# Patient Record
Sex: Female | Born: 1960 | Race: Black or African American | Hispanic: No | Marital: Single | Smoking: Never smoker
Health system: Southern US, Community
[De-identification: ages and names within clinical notes are randomized; demographics above are authoritative.]

## PROBLEM LIST (undated history)

## (undated) DIAGNOSIS — N959 Unspecified menopausal and perimenopausal disorder: Secondary | ICD-10-CM

## (undated) DIAGNOSIS — Q859 Phakomatosis, unspecified: Secondary | ICD-10-CM

## (undated) DIAGNOSIS — G4700 Insomnia, unspecified: Secondary | ICD-10-CM

## (undated) DIAGNOSIS — R739 Hyperglycemia, unspecified: Secondary | ICD-10-CM

## (undated) DIAGNOSIS — E7800 Pure hypercholesterolemia, unspecified: Secondary | ICD-10-CM

## (undated) DIAGNOSIS — Z803 Family history of malignant neoplasm of breast: Secondary | ICD-10-CM

## (undated) DIAGNOSIS — I10 Essential (primary) hypertension: Secondary | ICD-10-CM

## (undated) DIAGNOSIS — K219 Gastro-esophageal reflux disease without esophagitis: Secondary | ICD-10-CM

## (undated) DIAGNOSIS — E119 Type 2 diabetes mellitus without complications: Secondary | ICD-10-CM

## (undated) DIAGNOSIS — H8110 Benign paroxysmal vertigo, unspecified ear: Secondary | ICD-10-CM

## (undated) DIAGNOSIS — Z800 Family history of malignant neoplasm of digestive organs: Secondary | ICD-10-CM

## (undated) DIAGNOSIS — Z8041 Family history of malignant neoplasm of ovary: Secondary | ICD-10-CM

## (undated) DIAGNOSIS — M1990 Unspecified osteoarthritis, unspecified site: Secondary | ICD-10-CM

## (undated) DIAGNOSIS — R748 Abnormal levels of other serum enzymes: Secondary | ICD-10-CM

## (undated) HISTORY — PX: LAPAROSCOPIC CHOLECYSTECTOMY: SUR755

## (undated) HISTORY — DX: Family history of malignant neoplasm of digestive organs: Z80.0

## (undated) HISTORY — DX: Essential (primary) hypertension: I10

## (undated) HISTORY — DX: Gastro-esophageal reflux disease without esophagitis: K21.9

## (undated) HISTORY — DX: Benign paroxysmal vertigo, unspecified ear: H81.10

## (undated) HISTORY — DX: Unspecified menopausal and perimenopausal disorder: N95.9

## (undated) HISTORY — DX: Phakomatosis, unspecified: Q85.9

## (undated) HISTORY — DX: Pure hypercholesterolemia, unspecified: E78.00

## (undated) HISTORY — DX: Abnormal levels of other serum enzymes: R74.8

## (undated) HISTORY — PX: ABDOMINAL HYSTERECTOMY: SHX81

## (undated) HISTORY — DX: Type 2 diabetes mellitus without complications: E11.9

## (undated) HISTORY — DX: Family history of malignant neoplasm of breast: Z80.3

## (undated) HISTORY — PX: TOTAL ABDOMINAL HYSTERECTOMY W/ BILATERAL SALPINGOOPHORECTOMY: SHX83

## (undated) HISTORY — DX: Hyperglycemia, unspecified: R73.9

## (undated) HISTORY — DX: Family history of malignant neoplasm of ovary: Z80.41

## (undated) HISTORY — DX: Insomnia, unspecified: G47.00

---

## 2001-09-29 ENCOUNTER — Other Ambulatory Visit: Admission: RE | Admit: 2001-09-29 | Discharge: 2001-09-29 | Payer: Self-pay | Admitting: Family Medicine

## 2004-10-03 ENCOUNTER — Ambulatory Visit: Payer: Self-pay | Admitting: Family Medicine

## 2005-11-27 ENCOUNTER — Ambulatory Visit: Payer: Self-pay | Admitting: Family Medicine

## 2006-12-01 ENCOUNTER — Ambulatory Visit: Payer: Self-pay | Admitting: Family Medicine

## 2007-01-05 DIAGNOSIS — E7800 Pure hypercholesterolemia, unspecified: Secondary | ICD-10-CM | POA: Insufficient documentation

## 2007-05-07 ENCOUNTER — Ambulatory Visit: Payer: Self-pay | Admitting: Unknown Physician Specialty

## 2007-09-16 DIAGNOSIS — Z800 Family history of malignant neoplasm of digestive organs: Secondary | ICD-10-CM | POA: Insufficient documentation

## 2007-10-28 ENCOUNTER — Ambulatory Visit: Payer: Self-pay | Admitting: Gastroenterology

## 2008-02-25 ENCOUNTER — Ambulatory Visit: Payer: Self-pay

## 2008-03-17 DIAGNOSIS — G4700 Insomnia, unspecified: Secondary | ICD-10-CM | POA: Insufficient documentation

## 2009-03-07 ENCOUNTER — Ambulatory Visit: Payer: Self-pay

## 2010-03-15 ENCOUNTER — Ambulatory Visit: Payer: Self-pay

## 2011-05-07 ENCOUNTER — Ambulatory Visit: Payer: Self-pay

## 2011-05-16 ENCOUNTER — Ambulatory Visit: Payer: Self-pay | Admitting: General Practice

## 2011-05-18 ENCOUNTER — Ambulatory Visit: Payer: Self-pay | Admitting: General Practice

## 2011-06-17 ENCOUNTER — Ambulatory Visit: Payer: Self-pay | Admitting: General Practice

## 2012-05-13 ENCOUNTER — Ambulatory Visit: Payer: Self-pay | Admitting: Family Medicine

## 2013-01-11 ENCOUNTER — Ambulatory Visit: Payer: Self-pay | Admitting: Gastroenterology

## 2013-07-28 ENCOUNTER — Ambulatory Visit: Payer: Self-pay | Admitting: Family Medicine

## 2013-08-27 ENCOUNTER — Ambulatory Visit: Payer: Self-pay | Admitting: Specialist

## 2013-09-22 ENCOUNTER — Encounter: Payer: Self-pay | Admitting: Specialist

## 2013-10-17 ENCOUNTER — Encounter: Payer: Self-pay | Admitting: Specialist

## 2014-01-19 LAB — HM PAP SMEAR: HM PAP: NEGATIVE

## 2014-01-28 ENCOUNTER — Ambulatory Visit: Payer: Self-pay | Admitting: Cardiovascular Disease

## 2014-01-31 ENCOUNTER — Ambulatory Visit (INDEPENDENT_AMBULATORY_CARE_PROVIDER_SITE_OTHER): Payer: 59 | Admitting: Cardiovascular Disease

## 2014-01-31 ENCOUNTER — Encounter (INDEPENDENT_AMBULATORY_CARE_PROVIDER_SITE_OTHER): Payer: Self-pay

## 2014-01-31 ENCOUNTER — Encounter: Payer: Self-pay | Admitting: Cardiovascular Disease

## 2014-01-31 VITALS — BP 120/90 | HR 67 | Ht 72.0 in | Wt 235.0 lb

## 2014-01-31 DIAGNOSIS — M25519 Pain in unspecified shoulder: Secondary | ICD-10-CM | POA: Insufficient documentation

## 2014-01-31 DIAGNOSIS — E785 Hyperlipidemia, unspecified: Secondary | ICD-10-CM

## 2014-01-31 DIAGNOSIS — K219 Gastro-esophageal reflux disease without esophagitis: Secondary | ICD-10-CM

## 2014-01-31 DIAGNOSIS — R9431 Abnormal electrocardiogram [ECG] [EKG]: Secondary | ICD-10-CM

## 2014-01-31 DIAGNOSIS — R7303 Prediabetes: Secondary | ICD-10-CM | POA: Insufficient documentation

## 2014-01-31 DIAGNOSIS — R079 Chest pain, unspecified: Secondary | ICD-10-CM

## 2014-01-31 DIAGNOSIS — Z8249 Family history of ischemic heart disease and other diseases of the circulatory system: Secondary | ICD-10-CM

## 2014-01-31 DIAGNOSIS — R7309 Other abnormal glucose: Secondary | ICD-10-CM

## 2014-01-31 MED ORDER — PANTOPRAZOLE SODIUM 40 MG PO TBEC: 40.0000 mg | DELAYED_RELEASE_TABLET | Freq: Two times a day (BID) | ORAL | Status: DC

## 2014-01-31 NOTE — Assessment & Plan Note (Signed)
Complemented her on her recent weight loss. Encouraged her to continue walking

## 2014-01-31 NOTE — Assessment & Plan Note (Signed)
Nonspecific EKG changes, no significant difference from 2010. As she feels well with no symptoms concerning for angina, no further testing at this time. We did spend time talking about various anginal presentations and she will watch her symptoms closely with exertion.

## 2014-01-31 NOTE — Assessment & Plan Note (Signed)
She does report mildly elevated CK. This could be from muscle cramping associated with her simvastatin. She does have shoulder cramping at times. We did discuss possibly changing to an alternate statin. One option would be to try low-dose Crestor 5 mg daily. We have samples and coupons if she would like to try this.

## 2014-01-31 NOTE — Assessment & Plan Note (Addendum)
Her biggest complaint is GERD. She sleeping on 6 pillows. We have suggested she use last pillows, put a block under the head of her bed. We have sent in a prescription for Protonix 40 mg twice a day as she has not tried this PPI. She reports AcipHex is not working for her. Suggested she supplement with Pepcid or Zantac. If symptoms persist, she may benefit from an EGD

## 2014-01-31 NOTE — Patient Instructions (Signed)
You are doing well. Minimal EKG changes, very nonspecific Try protonix one pill twice a day for GERD  If shoulder cramping gets worse, call the office We could switch the simvastatin to alternate statin  Use less pillows, prop the feet of the bed  Please call us if you have new issues that need to be addressed before your next appt.

## 2014-01-31 NOTE — Assessment & Plan Note (Signed)
Symptoms seem musculoskeletal. Possibly exacerbated by her statin. Symptoms get worse, could consider changing her statin

## 2014-01-31 NOTE — Progress Notes (Signed)
Patient ID: Peter Garter, female    DOB: 1960-12-05, 53 y.o.   MRN: [id number]  HPI Comments: Ms Richeson is a very pleasant 53 year old woman with history of hyperlipidemia, borderline diabetes, who works at [employer] who presents by referral from Dolores Frame for abnormal EKG.  She reports that she is very active, does lots of walking on a regular basis, particularly in the weather. She does have occasional shoulder pain, sometimes on one side, sometimes on the other that she describes as a cramping. Sometimes has this shoulder pain at rest as well such as when she wakes up. She's been told that her CK is elevated and is being closely monitored.  Reports having a hemoglobin A1c the high 5, low 6 range She has recently lost significant weight through dietary changes. Overall she feels that she is doing well Her biggest complaint is her GERD . She has frequent belching, burning. She has tried omeprazole, Nexium, dexilant (allergy) She's not tried Protonix. She is currently on AcipHex and feels that it is not working. She is sleeping on 6 pillows an effort to help her symptoms.  EKG shows normal sinus rhythm with no significant ST changes. Nonspecific T wave abnormality in the inferior leads. No significant change from prior EKG in 2000 and. Similar to EKG at outside office 01/19/2014  Reports total cholesterol less than 200, possibly 180   Outpatient Encounter Prescriptions as of 01/31/2014  Medication Sig  . hydrochlorothiazide (MICROZIDE) 12.5 MG capsule Take 12.5 mg by mouth daily as needed.   . meloxicam (MOBIC) 15 MG tablet Take 15 mg by mouth daily.  . RABEprazole (ACIPHEX) 20 MG tablet Take 20 mg by mouth daily.  . simvastatin (ZOCOR) 10 MG tablet Take 10 mg by mouth daily.  . [DISCONTINUED] ALPRAZolam (XANAX) 0.5 MG tablet Take 0.5 mg by mouth at bedtime as needed for anxiety.     Review of Systems  Constitutional: Negative.   HENT: Negative.   Eyes: Negative.   Respiratory: Negative.    Cardiovascular: Negative.   Gastrointestinal: Negative.   Endocrine: Negative.   Musculoskeletal: Negative.   Skin: Negative.   Allergic/Immunologic: Negative.   Neurological: Negative.   Hematological: Negative.   Psychiatric/Behavioral: Negative.   All other systems reviewed and are negative.   BP 120/90  Pulse 67  Ht 6' (1.829 m)  Wt 235 lb (106.595 kg)  BMI 31.86 kg/m2  Physical Exam  Nursing note and vitals reviewed. Constitutional: She is oriented to person, place, and time. She appears well-developed and well-nourished.  HENT:  Head: Normocephalic.  Nose: Nose normal.  Mouth/Throat: Oropharynx is clear and moist.  Eyes: Conjunctivae are normal. Pupils are equal, round, and reactive to light.  Neck: Normal range of motion. Neck supple. No JVD present.  Cardiovascular: Normal rate, regular rhythm, S1 normal, S2 normal, normal heart sounds and intact distal pulses.  Exam reveals no gallop and no friction rub.   No murmur heard. Pulmonary/Chest: Effort normal and breath sounds normal. No respiratory distress. She has no wheezes. She has no rales. She exhibits no tenderness.  Abdominal: Soft. Bowel sounds are normal. She exhibits no distension. There is no tenderness.  Musculoskeletal: Normal range of motion. She exhibits no edema and no tenderness.  Lymphadenopathy:    She has no cervical adenopathy.  Neurological: She is alert and oriented to person, place, and time. Coordination normal.  Skin: Skin is warm and dry. No rash noted. No erythema.  Psychiatric: She has a normal mood and affect.  Her behavior is normal. Judgment and thought content normal.    Assessment and Plan

## 2014-02-14 ENCOUNTER — Ambulatory Visit: Payer: Self-pay | Admitting: Gastroenterology

## 2014-02-14 LAB — HM COLONOSCOPY: HM Colonoscopy: 1

## 2014-02-23 ENCOUNTER — Ambulatory Visit: Payer: Self-pay | Admitting: Family Medicine

## 2014-05-25 LAB — HEMOGLOBIN A1C: Hgb A1c MFr Bld: 6.1 % — AB (ref 4.0–6.0)

## 2014-07-03 ENCOUNTER — Emergency Department: Payer: Self-pay | Admitting: Emergency Medicine

## 2014-07-03 LAB — COMPREHENSIVE METABOLIC PANEL
ALK PHOS: 53 U/L
Albumin: 4.5 g/dL (ref 3.4–5.0)
Anion Gap: 8 (ref 7–16)
BUN: 17 mg/dL (ref 7–18)
Bilirubin,Total: 0.4 mg/dL (ref 0.2–1.0)
CALCIUM: 9.7 mg/dL (ref 8.5–10.1)
CO2: 30 mmol/L (ref 21–32)
Chloride: 103 mmol/L (ref 98–107)
Creatinine: 1.07 mg/dL (ref 0.60–1.30)
EGFR (African American): >60
EGFR (Non-African Amer.): 57 — ABNORMAL LOW
Glucose: 102 mg/dL — ABNORMAL HIGH (ref 65–99)
Osmolality: 283 (ref 275–301)
Potassium: 4.1 mmol/L (ref 3.5–5.1)
SGOT(AST): 23 U/L (ref 15–37)
SGPT (ALT): 43 U/L
Sodium: 141 mmol/L (ref 136–145)
TOTAL PROTEIN: 8.3 g/dL — AB (ref 6.4–8.2)

## 2014-07-03 LAB — CBC WITH DIFFERENTIAL/PLATELET
Basophil #: 0 10*3/uL (ref 0.0–0.1)
Basophil %: 0.9 %
EOS ABS: 0.1 10*3/uL (ref 0.0–0.7)
Eosinophil %: 1.2 %
HCT: 41.3 % (ref 35.0–47.0)
HGB: 13 g/dL (ref 12.0–16.0)
Lymphocyte #: 3.1 10*3/uL (ref 1.0–3.6)
Lymphocyte %: 55.6 %
MCH: 24.6 pg — AB (ref 26.0–34.0)
MCHC: 31.6 g/dL — ABNORMAL LOW (ref 32.0–36.0)
MCV: 78 fL — AB (ref 80–100)
MONO ABS: 0.3 x10 3/mm (ref 0.2–0.9)
MONOS PCT: 5.4 %
NEUTROS ABS: 2 10*3/uL (ref 1.4–6.5)
NEUTROS PCT: 36.9 %
PLATELETS: 264 10*3/uL (ref 150–440)
RBC: 5.3 10*6/uL — AB (ref 3.80–5.20)
RDW: 14.5 % (ref 11.5–14.5)
WBC: 5.5 10*3/uL (ref 3.6–11.0)

## 2014-07-03 LAB — TSH: Thyroid Stimulating Horm: 3.99 u[IU]/mL

## 2014-07-03 LAB — CK: CK, Total: 446 U/L — ABNORMAL HIGH

## 2014-07-13 LAB — HEPATIC FUNCTION PANEL
ALT: 42 U/L — AB (ref 7–35)
AST: 27 U/L (ref 13–35)

## 2014-07-13 LAB — CBC AND DIFFERENTIAL
HEMATOCRIT: 38 % (ref 36–46)
Hemoglobin: 13.1 g/dL (ref 12.0–16.0)
Platelets: 303 10*3/uL (ref 150–399)
WBC: 5.6 10^3/mL

## 2014-07-13 LAB — BASIC METABOLIC PANEL
BUN: 14 mg/dL (ref 4–21)
Creatinine: 1.1 mg/dL (ref 0.5–1.1)
GLUCOSE: 88 mg/dL
POTASSIUM: 4.4 mmol/L (ref 3.4–5.3)
SODIUM: 141 mmol/L (ref 137–147)

## 2014-08-01 LAB — LIPID PANEL
CHOLESTEROL: 250 mg/dL — AB (ref 0–200)
HDL: 49 mg/dL (ref 35–70)
LDL CALC: 163 mg/dL
Triglycerides: 192 mg/dL — AB (ref 40–160)

## 2014-08-10 ENCOUNTER — Ambulatory Visit: Payer: Self-pay | Admitting: Family Medicine

## 2014-08-10 LAB — HM MAMMOGRAPHY

## 2014-12-19 ENCOUNTER — Encounter: Payer: Self-pay | Admitting: *Deleted

## 2015-01-25 DIAGNOSIS — R748 Abnormal levels of other serum enzymes: Secondary | ICD-10-CM | POA: Insufficient documentation

## 2015-01-25 DIAGNOSIS — E010 Iodine-deficiency related diffuse (endemic) goiter: Secondary | ICD-10-CM | POA: Insufficient documentation

## 2015-01-25 DIAGNOSIS — I10 Essential (primary) hypertension: Secondary | ICD-10-CM | POA: Insufficient documentation

## 2015-01-25 DIAGNOSIS — F430 Acute stress reaction: Secondary | ICD-10-CM | POA: Insufficient documentation

## 2015-01-25 DIAGNOSIS — E538 Deficiency of other specified B group vitamins: Secondary | ICD-10-CM | POA: Insufficient documentation

## 2015-01-25 DIAGNOSIS — H8110 Benign paroxysmal vertigo, unspecified ear: Secondary | ICD-10-CM | POA: Insufficient documentation

## 2015-01-25 DIAGNOSIS — R9431 Abnormal electrocardiogram [ECG] [EKG]: Secondary | ICD-10-CM | POA: Insufficient documentation

## 2015-01-25 DIAGNOSIS — E559 Vitamin D deficiency, unspecified: Secondary | ICD-10-CM | POA: Insufficient documentation

## 2015-03-29 ENCOUNTER — Ambulatory Visit (INDEPENDENT_AMBULATORY_CARE_PROVIDER_SITE_OTHER): Payer: 59 | Admitting: Family Medicine

## 2015-03-29 ENCOUNTER — Other Ambulatory Visit: Payer: Self-pay | Admitting: Family Medicine

## 2015-03-29 ENCOUNTER — Encounter: Payer: Self-pay | Admitting: Family Medicine

## 2015-03-29 VITALS — BP 124/80 | HR 84 | Temp 98.1°F | Resp 16 | Ht 72.0 in | Wt 251.0 lb

## 2015-03-29 DIAGNOSIS — R7309 Other abnormal glucose: Secondary | ICD-10-CM

## 2015-03-29 DIAGNOSIS — R7303 Prediabetes: Secondary | ICD-10-CM

## 2015-03-29 DIAGNOSIS — Z1239 Encounter for other screening for malignant neoplasm of breast: Secondary | ICD-10-CM | POA: Diagnosis not present

## 2015-03-29 DIAGNOSIS — Z0000 Encounter for general adult medical examination without abnormal findings: Secondary | ICD-10-CM | POA: Diagnosis not present

## 2015-03-29 DIAGNOSIS — E7800 Pure hypercholesterolemia, unspecified: Secondary | ICD-10-CM

## 2015-03-29 DIAGNOSIS — R319 Hematuria, unspecified: Secondary | ICD-10-CM

## 2015-03-29 DIAGNOSIS — E559 Vitamin D deficiency, unspecified: Secondary | ICD-10-CM | POA: Diagnosis not present

## 2015-03-29 DIAGNOSIS — I10 Essential (primary) hypertension: Secondary | ICD-10-CM

## 2015-03-29 LAB — POCT URINALYSIS DIPSTICK
BILIRUBIN UA: NEGATIVE
GLUCOSE UA: NEGATIVE
Ketones, UA: NEGATIVE
Leukocytes, UA: NEGATIVE
Nitrite, UA: NEGATIVE
PROTEIN UA: NEGATIVE
Spec Grav, UA: 1.025
Urobilinogen, UA: 0.2
pH, UA: 5

## 2015-03-29 NOTE — Progress Notes (Signed)
Patient ID: Alexandra Mora, female   DOB: 03/08/1961, 54 y.o.   MRN: [id number]       Patient: Alexandra Mora, Female    DOB: 1961/07/27, 54 y.o.   MRN: [id number] Visit Date: 03/29/2015  Today's Provider: Margarita Rana, MD   Chief Complaint  Patient presents with  . Annual Exam   Subjective:    Annual physical exam Alexandra Mora is a 54 y.o. female who presents today for health maintenance and complete physical. She feels fairly well. She reports not exercising due to back and hip pain. She reports she is sleeping fairly well. Does think she is having trouble with menopause.   Still having hot flashes. Does not want take hormones. Having a lot of joint issues.   Does not want to take Mobic.  01/19/14 CPE 01/19/14 Pap-neg 08/10/14 Mammo-BI-RADS 1 02/14/14 Colonoscopy-polyp,recheck in 3 yrs 01/19/14 EKG    Results for orders placed or performed in visit on 03/29/15  POCT urinalysis dipstick  Result Value Ref Range   Color, UA straw    Clarity, UA clear    Glucose, UA neg    Bilirubin, UA neg    Ketones, UA neg    Spec Grav, UA 1.025    Blood, UA small    pH, UA 5.0    Protein, UA neg    Urobilinogen, UA 0.2    Nitrite, UA neg    Leukocytes, UA Negative Negative     Lab Results  Component Value Date   WBC 5.6 07/13/2014   HGB 13.1 07/13/2014   HCT 38 07/13/2014   PLT 303 07/13/2014   GLUCOSE 102* 07/03/2014   CHOL 250* 08/01/2014   TRIG 192* 08/01/2014   HDL 49 08/01/2014   LDLCALC 163 08/01/2014   ALT 42* 07/13/2014   AST 27 07/13/2014   NA 141 07/13/2014   K 4.4 07/13/2014   CL 103 07/03/2014   CREATININE 1.1 07/13/2014   BUN 14 07/13/2014   CO2 30 07/03/2014   HGBA1C 6.1* 05/25/2014    -----------------------------------------------------------------   Review of Systems  Constitutional: Negative.   HENT: Negative.   Eyes: Negative.   Respiratory: Negative.   Cardiovascular: Negative.   Gastrointestinal: Negative.   Endocrine: Negative.   Genitourinary:  Negative.   Musculoskeletal: Positive for back pain and arthralgias.  Skin: Negative.   Allergic/Immunologic: Negative.   Neurological: Negative.   Hematological: Negative.   Psychiatric/Behavioral: Negative.     Social History She  reports that she has never smoked. She has never used smokeless tobacco. She reports that she does not drink alcohol or use illicit drugs.  Patient Active Problem List   Diagnosis Date Noted  . Acute stress disorder 01/25/2015  . Benign paroxysmal positional nystagmus 01/25/2015  . Abnormal ECG 01/25/2015  . Elevated CK 01/25/2015  . Big thyroid 01/25/2015  . BP (high blood pressure) 01/25/2015  . Low serum cobalamin 01/25/2015  . Avitaminosis D 01/25/2015  . GERD (gastroesophageal reflux disease) 01/31/2014  . Borderline diabetes 01/31/2014  . Family history of coronary artery disease 01/31/2014  . Shoulder pain 01/31/2014  . Abnormal EKG 01/31/2014  . Hyperlipidemia 01/31/2014  . Cannot sleep 03/17/2008  . Family history of colon cancer 09/16/2007  . Hypercholesteremia 01/05/2007    Past Surgical History  Procedure Laterality Date  . Total abdominal hysterectomy w/ bilateral salpingoophorectomy    . Laparoscopic cholecystectomy    . Abdominal hysterectomy      Family History Her family history includes Breast  cancer in her other; Colon cancer in her other; Diabetes in her other; Heart attack (age of onset: 3) in her mother; Heart disease in her mother; Hyperlipidemia in her mother; Hypertension in her brother, father, mother, sister, sister, and sister; Stroke in her mother.    Allergies  Allergen Reactions  . Dexilant [Dexlansoprazole]     Severe diarrhea    Previous Medications   AMLODIPINE (NORVASC) 2.5 MG TABLET    Take by mouth.   MELOXICAM (MOBIC) 15 MG TABLET    Take 15 mg by mouth daily.   PANTOPRAZOLE (PROTONIX) 40 MG TABLET    Take 1 tablet (40 mg total) by mouth 2 (two) times daily.   PROBIOTIC PRODUCT (PHILLIPS COLON  HEALTH) CAPS    Take 1 capsule by mouth daily.   SIMVASTATIN (ZOCOR) 10 MG TABLET    Take 10 mg by mouth daily.   VITAMIN D, ERGOCALCIFEROL, (DRISDOL) 50000 UNITS CAPS CAPSULE    Take by mouth.    Patient Care Team: Margarita Rana, MD as PCP - General (Family Medicine)     Objective:   Vitals: BP 124/80 mmHg  Pulse 84  Temp(Src) 98.1 F (36.7 C) (Oral)  Resp 16  Ht 6' (1.829 m)  Wt 251 lb (113.853 kg)  BMI 34.03 kg/m2  SpO2 99%   Physical Exam  Constitutional: She is oriented to person, place, and time. She appears well-developed and well-nourished.  HENT:  Head: Normocephalic and atraumatic.  Right Ear: Tympanic membrane, external ear and ear canal normal.  Left Ear: Tympanic membrane, external ear and ear canal normal.  Nose: Nose normal.  Mouth/Throat: Uvula is midline, oropharynx is clear and moist and mucous membranes are normal.  Eyes: Conjunctivae, EOM and lids are normal. Pupils are equal, round, and reactive to light.  Neck: Trachea normal and normal range of motion. Neck supple. Carotid bruit is not present. No thyroid mass and no thyromegaly present.  Cardiovascular: Normal rate, regular rhythm and normal heart sounds.   Pulmonary/Chest: Effort normal and breath sounds normal.  Abdominal: Soft. Normal appearance and bowel sounds are normal. There is no hepatosplenomegaly. There is no tenderness.  Genitourinary: No breast swelling, tenderness or discharge.  Musculoskeletal: Normal range of motion.  Lymphadenopathy:    She has no cervical adenopathy.    She has no axillary adenopathy.  Neurological: She is alert and oriented to person, place, and time. She has normal strength. No cranial nerve deficit.  Skin: Skin is warm, dry and intact.  Psychiatric: She has a normal mood and affect. Her speech is normal and behavior is normal. Judgment and thought content normal. Cognition and memory are normal.     Depression Screen PHQ 2/9 Scores 03/29/2015  PHQ - 2 Score 0        Assessment & Plan:     Routine Health Maintenance and Physical Exam  Exercise Activities and Dietary recommendations Goals    . Exercise 150 minutes per week (moderate activity)       Immunization History  Administered Date(s) Administered  . Tdap 01/19/2014    Health Maintenance  Topic Date Due  . HIV Screening  08/05/1977  . INFLUENZA VACCINE  04/17/2015  . MAMMOGRAM  08/10/2016  . PAP SMEAR  01/19/2017  . TETANUS/TDAP  01/20/2024  . COLONOSCOPY  02/15/2024         1. Annual physical exam Stable. Patient advised to continue eating healthy and exercise daily.  2. Hematuria F/U pending lab report. - POCT urinalysis dipstick -  Urine Microscopic  3. Essential hypertension - CBC with Differential/Platelet  4. Borderline diabetes - Hemoglobin A1c  5. Avitaminosis D - Vit D  25 hydroxy (rtn osteoporosis monitoring)  6. Hypercholesteremia - Lipid Panel With LDL/HDL Ratio  7. Breast cancer screening - MM DIGITAL SCREENING BILATERAL; Future       Patient seen and examined by Dr. Jerrell Belfast, and note scribed by Philbert Riser. Dimas, CMA. I have reviewed the document for accuracy and completeness and I agree with above. Jerrell Belfast, MD   Margarita Rana, MD        --------------------------------------------------------------------

## 2015-03-30 LAB — URINALYSIS, MICROSCOPIC ONLY
Casts: NONE SEEN /lpf
RBC, UA: NONE SEEN /hpf (ref 0–?)

## 2015-03-30 LAB — CBC WITH DIFFERENTIAL/PLATELET
Basophils Absolute: 0 10*3/uL (ref 0.0–0.2)
Basos: 1 %
EOS (ABSOLUTE): 0.1 10*3/uL (ref 0.0–0.4)
Eos: 2 %
Hematocrit: 38.3 % (ref 34.0–46.6)
Hemoglobin: 12.6 g/dL (ref 11.1–15.9)
Immature Grans (Abs): 0 10*3/uL (ref 0.0–0.1)
Immature Granulocytes: 0 %
Lymphocytes Absolute: 2.8 10*3/uL (ref 0.7–3.1)
Lymphs: 58 %
MCH: 24.9 pg — ABNORMAL LOW (ref 26.6–33.0)
MCHC: 32.9 g/dL (ref 31.5–35.7)
MCV: 76 fL — ABNORMAL LOW (ref 79–97)
Monocytes Absolute: 0.2 10*3/uL (ref 0.1–0.9)
Monocytes: 4 %
Neutrophils Absolute: 1.7 10*3/uL (ref 1.4–7.0)
Neutrophils: 35 %
Platelets: 281 10*3/uL (ref 150–379)
RBC: 5.07 x10E6/uL (ref 3.77–5.28)
RDW: 15.3 % (ref 12.3–15.4)
WBC: 4.8 10*3/uL (ref 3.4–10.8)

## 2015-03-30 LAB — LIPID PANEL WITH LDL/HDL RATIO
Cholesterol, Total: 200 mg/dL — ABNORMAL HIGH (ref 100–199)
HDL: 49 mg/dL (ref >39)
LDL Calculated: 127 mg/dL — ABNORMAL HIGH (ref 0–99)
LDl/HDL Ratio: 2.6 ratio units (ref 0.0–3.2)
Triglycerides: 119 mg/dL (ref 0–149)
VLDL Cholesterol Cal: 24 mg/dL (ref 5–40)

## 2015-03-30 LAB — HEMOGLOBIN A1C
Est. average glucose Bld gHb Est-mCnc: 140 mg/dL
Hgb A1c MFr Bld: 6.5 % — ABNORMAL HIGH (ref 4.8–5.6)

## 2015-03-30 LAB — VITAMIN D 25 HYDROXY (VIT D DEFICIENCY, FRACTURES): Vit D, 25-Hydroxy: 24 ng/mL — ABNORMAL LOW (ref 30.0–100.0)

## 2015-04-04 ENCOUNTER — Telehealth: Payer: Self-pay

## 2015-04-04 NOTE — Telephone Encounter (Signed)
Pt advised as directed below.  She is not having any urinary symptoms.  Right now she is taking Vitamin D 50,000 units once a week.  Also she wants to work on lifestyle changes before starting Metformin and increasing her Simvastatin.  She scheduled a follow up visit for 07/05/2015.    Thanks,   -Mickel Baas

## 2015-04-04 NOTE — Telephone Encounter (Signed)
Ok. Just continue current plan and will see her in October. Thanks.

## 2015-04-04 NOTE — Telephone Encounter (Signed)
-----   Message from Margarita Rana, MD sent at 04/01/2015  3:47 PM EDT ----- Labs with several abnormalities.  Vit D is slightly low, please clarify if taking supplement. Urine with inflammation, please see if having any symptoms. Blood glucose increased, now at range of diabetic, with HgA1c at 6.5. Please see if would like to start medication, Metformin, to decrease blood sugar and help with weight loss, or work on lifestyle changes. Needs 3 month follow up scheduled to recheck. Also, cholesterol is 200, but in light of increased blood sugar, a higher dose of statin is recommended. Please see if wants to increase Simvastatin also. Can also refer patient to lifestyle center if she would like. Thanks.

## 2015-07-05 ENCOUNTER — Encounter: Payer: Self-pay | Admitting: Family Medicine

## 2015-07-05 ENCOUNTER — Ambulatory Visit (INDEPENDENT_AMBULATORY_CARE_PROVIDER_SITE_OTHER): Payer: 59 | Admitting: Family Medicine

## 2015-07-05 VITALS — BP 112/78 | HR 88 | Temp 98.6°F | Resp 16 | Wt 247.0 lb

## 2015-07-05 DIAGNOSIS — E119 Type 2 diabetes mellitus without complications: Secondary | ICD-10-CM | POA: Diagnosis not present

## 2015-07-05 DIAGNOSIS — E7800 Pure hypercholesterolemia, unspecified: Secondary | ICD-10-CM

## 2015-07-05 DIAGNOSIS — I10 Essential (primary) hypertension: Secondary | ICD-10-CM

## 2015-07-05 DIAGNOSIS — G4700 Insomnia, unspecified: Secondary | ICD-10-CM | POA: Diagnosis not present

## 2015-07-05 DIAGNOSIS — K219 Gastro-esophageal reflux disease without esophagitis: Secondary | ICD-10-CM | POA: Diagnosis not present

## 2015-07-05 LAB — POCT GLYCOSYLATED HEMOGLOBIN (HGB A1C)
Est. average glucose Bld gHb Est-mCnc: 131
HEMOGLOBIN A1C: 6.2

## 2015-07-05 LAB — POCT UA - MICROALBUMIN: Microalbumin Ur, POC: 20 mg/L

## 2015-07-05 MED ORDER — CLONAZEPAM 0.5 MG PO TABS: 0.5000 mg | ORAL_TABLET | Freq: Every day | ORAL | Status: DC

## 2015-07-05 MED ORDER — RABEPRAZOLE SODIUM 20 MG PO TBEC: 20.0000 mg | DELAYED_RELEASE_TABLET | Freq: Every day | ORAL | Status: DC

## 2015-07-05 MED ORDER — LISINOPRIL 10 MG PO TABS: 10.0000 mg | ORAL_TABLET | Freq: Every day | ORAL | Status: DC

## 2015-07-05 MED ORDER — SIMVASTATIN 20 MG PO TABS: 20.0000 mg | ORAL_TABLET | Freq: Every day | ORAL | Status: DC

## 2015-07-05 NOTE — Progress Notes (Signed)
Subjective:    Patient ID: Alexandra Mora, female    DOB: 1960-09-26, 54 y.o.   MRN: [id number]  Diabetes She presents for her initial (Last A1C was checked 03/29/2015 and was 6.5%) diabetic visit. She has type 2 diabetes mellitus. No MedicAlert identification noted. Her disease course has been improving. Pertinent negatives for hypoglycemia include no headaches. Associated symptoms include weight loss (intentional- pt lost 4 lbs since LOV). Pertinent negatives for diabetes include no blurred vision, no chest pain, no fatigue, no foot paresthesias, no foot ulcerations, no polydipsia, no polyphagia, no polyuria, no visual change and no weakness. Symptoms are improving. Risk factors for coronary artery disease include dyslipidemia, diabetes mellitus and hypertension. When asked about current treatments, none (pt chooses to work on lifestyle changes) were reported. She is compliant with treatment all of the time. She participates in exercise every other day (walks 2-3 miles 4 days a week). Home blood sugar record trend: not being checked. An ACE inhibitor/angiotensin II receptor blocker is not being taken.  Hypertension This is a chronic problem. The problem is unchanged. The problem is controlled. Pertinent negatives include no anxiety, blurred vision, chest pain, headaches, malaise/fatigue, neck pain, orthopnea, palpitations, peripheral edema or shortness of breath. Past treatments include calcium channel blockers (Amlodipine 2.5 mg).  Hyperlipidemia This is a chronic (PCP urged pt to increase Statin secondary to new diagnosis of DM; pt chose to work on lifestyle changes) problem. Pertinent negatives include no chest pain or shortness of breath. Current antihyperlipidemic treatment includes statins, exercise and diet change (Simvastain 10 mg (currently taking 20 mg po qd)). There are no compliance problems.    Also needs refill of her Aciphex.  Protonix does not work.     Review of Systems   Constitutional: Positive for weight loss (intentional- pt lost 4 lbs since LOV). Negative for malaise/fatigue and fatigue.  Eyes: Negative for blurred vision.  Respiratory: Negative for shortness of breath.   Cardiovascular: Negative for chest pain, palpitations and orthopnea.  Endocrine: Negative for polydipsia, polyphagia and polyuria.  Musculoskeletal: Negative for neck pain.  Neurological: Negative for weakness and headaches.   BP 112/78 mmHg  Pulse 88  Temp(Src) 98.6 F (37 C) (Oral)  Resp 16  Wt 247 lb (112.038 kg)   Patient Active Problem List   Diagnosis Date Noted  . Hematuria 03/29/2015  . Acute stress disorder 01/25/2015  . Benign paroxysmal positional nystagmus 01/25/2015  . Abnormal ECG 01/25/2015  . Elevated CK 01/25/2015  . Big thyroid 01/25/2015  . BP (high blood pressure) 01/25/2015  . Low serum cobalamin 01/25/2015  . Avitaminosis D 01/25/2015  . GERD (gastroesophageal reflux disease) 01/31/2014  . Borderline diabetes 01/31/2014  . Family history of coronary artery disease 01/31/2014  . Shoulder pain 01/31/2014  . Abnormal EKG 01/31/2014  . Hyperlipidemia 01/31/2014  . Cannot sleep 03/17/2008  . Family history of colon cancer 09/16/2007  . Hypercholesteremia 01/05/2007   Past Medical History  Diagnosis Date  . GERD (gastroesophageal reflux disease)   . Menopausal disorder   . Benign paroxysmal positional vertigo   . Hyperglycemia   . Elevated CK   . Hypertension   . Hypercholesteremia   . Insomnia   . Diabetes mellitus without complication [facility])    Current Outpatient Prescriptions on File Prior to Visit  Medication Sig  . amLODipine (NORVASC) 2.5 MG tablet Take by mouth.  . meloxicam (MOBIC) 15 MG tablet Take 15 mg by mouth as needed.   . Probiotic Product (PHILLIPS  COLON HEALTH) CAPS Take 1 capsule by mouth daily.  . simvastatin (ZOCOR) 10 MG tablet Take 20 mg by mouth daily.   . Vitamin D, Ergocalciferol, (DRISDOL) 50000 UNITS CAPS capsule  Take by mouth.   No current facility-administered medications on file prior to visit.   Allergies  Allergen Reactions  . Dexilant [Dexlansoprazole]     Severe diarrhea   Past Surgical History  Procedure Laterality Date  . Total abdominal hysterectomy w/ bilateral salpingoophorectomy    . Laparoscopic cholecystectomy    . Abdominal hysterectomy     Social History   Social History  . Marital Status: Single    Spouse Name: N/A  . Number of Children: 1  . Years of Education: N/A   Occupational History  . Not on file.   Social History Main Topics  . Smoking status: Never Smoker   . Smokeless tobacco: Never Used  . Alcohol Use: No  . Drug Use: No  . Sexual Activity: No   Other Topics Concern  . Not on file   Social History Narrative   Family History  Problem Relation Age of Onset  . Heart disease Mother   . Heart attack Mother 33  . Hyperlipidemia Mother   . Hypertension Mother   . Stroke Mother   . Hypertension Father   . Hypertension Sister   . Hypertension Brother   . Hypertension Sister   . Hypertension Sister   . Colon cancer Other   . Breast cancer Other   . Diabetes Other        Objective:   Physical Exam  Constitutional: She is oriented to person, place, and time. She appears well-developed and well-nourished.  Cardiovascular: Normal rate and regular rhythm.   Pulmonary/Chest: Effort normal and breath sounds normal.  Neurological: She is alert and oriented to person, place, and time.  Psychiatric: She has a normal mood and affect. Her behavior is normal. Judgment and thought content normal.    BP 112/78 mmHg  Pulse 88  Temp(Src) 98.6 F (37 C) (Oral)  Resp 16  Wt 247 lb (112.038 kg)      Assessment & Plan:  1. Controlled type 2 diabetes mellitus without complication, without long-term current use of insulin (HCC) Stable.  Improved. Continue current medication, plan of care.   - POCT glycosylated hemoglobin (Hb A1C) - POCT UA -  Microalbumin Results for orders placed or performed in visit on 07/05/15  POCT glycosylated hemoglobin (Hb A1C)  Result Value Ref Range   Hemoglobin A1C 6.2    Est. average glucose Bld gHb Est-mCnc 131   POCT UA - Microalbumin  Result Value Ref Range   Microalbumin Ur, POC 20 mg/L    2. Essential hypertension Does have a small amount of protein in urine. Will treat with Lisinopril, warned about possible side effects, cough or angioedema.  Will stop amlodipine and monitor BP.  Call if any side effects or bp not stable. Will cut in half if goes to low.  - lisinopril (PRINIVIL,ZESTRIL) 10 MG tablet; Take 1 tablet (10 mg total) by mouth daily.  Dispense: 90 tablet; Refill: 3  3. Hypercholesteremia Stable. Continue current medication and plan of care.  - simvastatin (ZOCOR) 20 MG tablet; Take 1 tablet (20 mg total) by mouth at bedtime.  Dispense: 90 tablet; Refill: 3  4. Gastroesophageal reflux disease without esophagitis Will change medication and recheck.  - RABEprazole (ACIPHEX) 20 MG tablet; Take 1 tablet (20 mg total) by mouth daily.  Dispense: 90  tablet; Refill: 3  5. Cannot sleep Has failed multiple medications. Will try Klonopin.  - clonazePAM (KLONOPIN) 0.5 MG tablet; Take 1 tablet (0.5 mg total) by mouth at bedtime.  Dispense: 30 tablet; Refill: 5  Margarita Rana, MD

## 2015-08-16 ENCOUNTER — Other Ambulatory Visit: Payer: Self-pay | Admitting: Family Medicine

## 2015-08-16 ENCOUNTER — Ambulatory Visit
Admission: RE | Admit: 2015-08-16 | Discharge: 2015-08-16 | Disposition: A | Discharge status: Home / Self Care | Payer: 59 | Source: Ambulatory Visit | Attending: Family Medicine | Admitting: Family Medicine

## 2015-08-16 DIAGNOSIS — Z1239 Encounter for other screening for malignant neoplasm of breast: Secondary | ICD-10-CM

## 2015-08-16 DIAGNOSIS — Z1231 Encounter for screening mammogram for malignant neoplasm of breast: Secondary | ICD-10-CM | POA: Insufficient documentation

## 2015-10-04 ENCOUNTER — Other Ambulatory Visit: Payer: Self-pay | Admitting: Family Medicine

## 2015-10-04 ENCOUNTER — Ambulatory Visit (INDEPENDENT_AMBULATORY_CARE_PROVIDER_SITE_OTHER): Payer: 59 | Admitting: Family Medicine

## 2015-10-04 ENCOUNTER — Encounter: Payer: Self-pay | Admitting: Family Medicine

## 2015-10-04 VITALS — BP 118/70 | HR 76 | Temp 98.9°F | Resp 16 | Wt 250.0 lb

## 2015-10-04 DIAGNOSIS — M545 Low back pain, unspecified: Secondary | ICD-10-CM | POA: Insufficient documentation

## 2015-10-04 DIAGNOSIS — E559 Vitamin D deficiency, unspecified: Secondary | ICD-10-CM

## 2015-10-04 DIAGNOSIS — I10 Essential (primary) hypertension: Secondary | ICD-10-CM

## 2015-10-04 DIAGNOSIS — G4700 Insomnia, unspecified: Secondary | ICD-10-CM

## 2015-10-04 DIAGNOSIS — E119 Type 2 diabetes mellitus without complications: Secondary | ICD-10-CM

## 2015-10-04 DIAGNOSIS — E785 Hyperlipidemia, unspecified: Secondary | ICD-10-CM

## 2015-10-04 LAB — POCT GLYCOSYLATED HEMOGLOBIN (HGB A1C): HEMOGLOBIN A1C: 6.5

## 2015-10-04 MED ORDER — MIRTAZAPINE 15 MG PO TABS: 15.0000 mg | ORAL_TABLET | Freq: Every day | ORAL | Status: DC

## 2015-10-04 MED ORDER — MELOXICAM 15 MG PO TABS: 15.0000 mg | ORAL_TABLET | ORAL | Status: DC | PRN

## 2015-10-04 MED ORDER — VITAMIN D (ERGOCALCIFEROL) 1.25 MG (50000 UNIT) PO CAPS: 50000.0000 [IU] | ORAL_CAPSULE | ORAL | Status: DC

## 2015-10-04 NOTE — Progress Notes (Signed)
Patient ID: [name], female   DOB: March 26, 1961, 55 y.o.   MRN: [id number]        Patient: ZOEJANE CZECHOWSKI Female    DOB: [birth year]   55 y.o.   MRN: [id number] Visit Date: 10/04/2015  Today's Provider: Margarita Rana, MD   Chief Complaint  Patient presents with  . Hypertension  . Diabetes  . Hyperlipidemia  . Insomnia   Subjective:    Diabetes She presents for her follow-up diabetic visit. She has type 2 diabetes mellitus. Her disease course has been stable. There are no hypoglycemic associated symptoms. Pertinent negatives for hypoglycemia include no confusion, dizziness, headaches, nervousness/anxiousness or sweats. Pertinent negatives for diabetes include no blurred vision and no chest pain. Symptoms are stable. Risk factors for coronary artery disease include diabetes mellitus, dyslipidemia and hypertension. Current diabetic treatment includes oral agent (monotherapy). She is compliant with treatment all of the time. Her weight is stable. She rarely participates in exercise. There is no change in her home blood glucose trend. An ACE inhibitor/angiotensin II receptor blocker is being taken. She does not see a podiatrist.Eye exam is current.  Hypertension This is a chronic problem. The problem is unchanged. The problem is controlled. Pertinent negatives include no anxiety, blurred vision, chest pain, headaches, malaise/fatigue, neck pain, palpitations, peripheral edema, shortness of breath or sweats. Risk factors for coronary artery disease include diabetes mellitus, dyslipidemia and obesity. Past treatments include ACE inhibitors. There are no compliance problems.   Hyperlipidemia This is a chronic problem. The problem is controlled. Pertinent negatives include no chest pain or shortness of breath. Current antihyperlipidemic treatment includes statins. There are no compliance problems.  Risk factors for coronary artery disease include diabetes mellitus, dyslipidemia, hypertension and  obesity.  Insomnia Primary symptoms: fragmented sleep, sleep disturbance, difficulty falling asleep, no malaise/fatigue.  The problem occurs nightly. The problem is unchanged. How many beverages per day that contain caffeine: 0 - 1.  Past treatments include medication. The treatment provided no relief. Typical bedtime:  8-10 P.M..  How long after going to bed to you fall asleep: less than 15 minutes.      Lab Results  Component Value Date   HGBA1C 6.2 07/05/2015   Lab Results  Component Value Date   CHOL 200* 03/29/2015   HDL 49 03/29/2015   LDLCALC 127* 03/29/2015   TRIG 119 03/29/2015        Allergies  Allergen Reactions  . Dexilant [Dexlansoprazole]     Severe diarrhea   Previous Medications   ASCORBIC ACID (VITAMIN C) 500 MG TABLET    Take 500 mg by mouth daily.   CHOLECALCIFEROL (VITAMIN D3) 5000 UNITS CAPS    Take 1 capsule by mouth daily.   CLONAZEPAM (KLONOPIN) 0.5 MG TABLET    Take 1 tablet (0.5 mg total) by mouth at bedtime.   LISINOPRIL (PRINIVIL,ZESTRIL) 10 MG TABLET    Take 1 tablet (10 mg total) by mouth daily.   PROBIOTIC PRODUCT (PHILLIPS COLON HEALTH) CAPS    Take 1 capsule by mouth daily.   RABEPRAZOLE (ACIPHEX) 20 MG TABLET    Take 1 tablet (20 mg total) by mouth daily.   SIMVASTATIN (ZOCOR) 20 MG TABLET    Take 1 tablet (20 mg total) by mouth at bedtime.   VITAMIN B-12 (CYANOCOBALAMIN) 100 MCG TABLET    Take 100 mcg by mouth daily.    Review of Systems  Constitutional: Negative.  Negative for malaise/fatigue.  Eyes: Negative for blurred vision.  Respiratory: Negative.  Negative for shortness of breath.   Cardiovascular: Negative.  Negative for chest pain and palpitations.  Gastrointestinal: Positive for constipation. Negative for vomiting, abdominal pain, diarrhea, blood in stool, abdominal distention, anal bleeding and rectal pain.  Endocrine: Negative.   Musculoskeletal: Negative.  Negative for neck pain.  Neurological: Negative for dizziness,  light-headedness and headaches.  Psychiatric/Behavioral: Positive for sleep disturbance. Negative for suicidal ideas, hallucinations, behavioral problems, confusion, dysphoric mood, decreased concentration and agitation. The patient has insomnia. The patient is not nervous/anxious and is not hyperactive.     Social History  Substance Use Topics  . Smoking status: Never Smoker   . Smokeless tobacco: Never Used  . Alcohol Use: No   Objective:   BP 118/70 mmHg  Pulse 76  Temp(Src) 98.9 F (37.2 C) (Oral)  Resp 16  Wt 250 lb (113.399 kg)  Results for orders placed or performed in visit on 10/04/15  POCT glycosylated hemoglobin (Hb A1C)  Result Value Ref Range   Hemoglobin A1C 6.5     Physical Exam  Constitutional: She is oriented to person, place, and time. She appears well-developed and well-nourished.  Cardiovascular: Normal rate, regular rhythm and normal heart sounds.   Pulmonary/Chest: Effort normal and breath sounds normal.  Neurological: She is alert and oriented to person, place, and time.  Psychiatric: She has a normal mood and affect. Her behavior is normal. Judgment and thought content normal.        Assessment & Plan:      1. Essential hypertension  Stable, continue current medications.  2. Controlled type 2 diabetes mellitus without complication, without long-term current use of insulin (HCC)  A1C slightly higher at 6.5%, but not enough to increase her Metformin.  Pt will continue to work on lifestyle changes.  Recheck in 3 months.  - POCT glycosylated hemoglobin (Hb A1C)  3. Hyperlipidemia  Stable, continue current medications.  4. Insomnia  Chronic issue; pt has tried multiple medications with no success.  Pt recently tried Clonazepam; she reports it did not help.  Still wakes up several times a night and not able to fall back to sleep.  Will try Remeron 15mg , recheck at next follow up.    5. Avitaminosis D  Stable, Refills provided.   - Vitamin  D, Ergocalciferol, (DRISDOL) 50000 units CAPS capsule; Take 1 capsule (50,000 Units total) by mouth every 7 (seven) days.  Dispense: 12 capsule; Refill: 1  6. Low back pain, unspecified back pain laterality, with sciatica presence unspecified  Pt only uses Meloxicam as needed.  Refills provided.   - meloxicam (MOBIC) 15 MG tablet; Take 1 tablet (15 mg total) by mouth as needed.  Dispense: 90 tablet; Refill: 1      Patient was seen and examined by Jerrell Belfast, MD, and note scribed by Ashley Royalty, CMA. I have reviewed the document for accuracy and completeness and I agree with above. - Jerrell Belfast, MD  Margarita Rana, MD  [facility]

## 2015-10-05 ENCOUNTER — Encounter: Payer: Self-pay | Admitting: Family Medicine

## 2015-10-16 ENCOUNTER — Encounter: Payer: Self-pay | Admitting: Family Medicine

## 2016-01-24 ENCOUNTER — Ambulatory Visit: Payer: 59 | Admitting: Nurse Practitioner

## 2016-02-28 ENCOUNTER — Ambulatory Visit (INDEPENDENT_AMBULATORY_CARE_PROVIDER_SITE_OTHER): Payer: 59 | Admitting: Family Medicine

## 2016-02-28 ENCOUNTER — Encounter: Payer: Self-pay | Admitting: Family Medicine

## 2016-02-28 VITALS — BP 112/72 | HR 80 | Temp 98.4°F | Resp 16 | Wt 247.0 lb

## 2016-02-28 DIAGNOSIS — R102 Pelvic and perineal pain: Secondary | ICD-10-CM

## 2016-02-28 DIAGNOSIS — R109 Unspecified abdominal pain: Secondary | ICD-10-CM | POA: Diagnosis not present

## 2016-02-28 DIAGNOSIS — R319 Hematuria, unspecified: Secondary | ICD-10-CM | POA: Diagnosis not present

## 2016-02-28 DIAGNOSIS — N951 Menopausal and female climacteric states: Secondary | ICD-10-CM

## 2016-02-28 LAB — POCT URINALYSIS DIPSTICK
Bilirubin, UA: NEGATIVE
GLUCOSE UA: NEGATIVE
Ketones, UA: NEGATIVE
Leukocytes, UA: NEGATIVE
NITRITE UA: NEGATIVE
PH UA: 6.5
PROTEIN UA: NEGATIVE
SPEC GRAV UA: 1.015
UROBILINOGEN UA: 0.2

## 2016-02-28 NOTE — Progress Notes (Signed)
Subjective:    Patient ID: [name], female    DOB: 1960-11-17, 55 y.o.   MRN: [id number]  Pelvic Pain The patient's primary symptoms include pelvic pain ("heaviness"). The patient's pertinent negatives include no genital itching, genital lesions, genital odor, vaginal bleeding or vaginal discharge. This is a new problem. The current episode started in the past 7 days (x 4 days). The problem occurs daily. The problem has been unchanged (intermittent "stabbing pain"). The problem affects the left side. Associated symptoms include constipation (normal for pt). Pertinent negatives include no abdominal pain, anorexia, back pain, chills, diarrhea, discolored urine, dysuria, fever, flank pain, frequency, hematuria, nausea, urgency or vomiting. Nothing aggravates the symptoms. She has tried nothing for the symptoms. She is not sexually active. She is postmenopausal. Her past medical history is significant for an abdominal surgery (partial hysterectomy; still has ovaries). There is no history of ovarian cysts.      Review of Systems  Constitutional: Negative for fever and chills.  Gastrointestinal: Positive for constipation (normal for pt). Negative for nausea, vomiting, abdominal pain, diarrhea and anorexia.  Genitourinary: Positive for pelvic pain ("heaviness"). Negative for dysuria, urgency, frequency, hematuria, flank pain and vaginal discharge.  Musculoskeletal: Negative for back pain.   BP 112/72 mmHg  Pulse 80  Temp(Src) 98.4 F (36.9 C) (Oral)  Resp 16  Wt 247 lb (112.038 kg)   Patient Active Problem List   Diagnosis Date Noted  . Low back pain 10/04/2015  . Diabetes type 2, controlled ([place]) 07/05/2015  . Hematuria 03/29/2015  . Acute stress disorder 01/25/2015  . Benign paroxysmal positional nystagmus 01/25/2015  . Abnormal ECG 01/25/2015  . Elevated CK 01/25/2015  . Big thyroid 01/25/2015  . BP (high blood pressure) 01/25/2015  . Low serum cobalamin 01/25/2015  .  Avitaminosis D 01/25/2015  . GERD (gastroesophageal reflux disease) 01/31/2014  . Borderline diabetes 01/31/2014  . Family history of coronary artery disease 01/31/2014  . Shoulder pain 01/31/2014  . Abnormal EKG 01/31/2014  . Hyperlipidemia 01/31/2014  . Insomnia 03/17/2008  . Family history of colon cancer 09/16/2007  . Hypercholesteremia 01/05/2007   Past Medical History  Diagnosis Date  . GERD (gastroesophageal reflux disease)   . Menopausal disorder   . Benign paroxysmal positional vertigo   . Hyperglycemia   . Elevated CK   . Hypertension   . Hypercholesteremia   . Insomnia   . Diabetes mellitus without complication [facility])    Current Outpatient Prescriptions on File Prior to Visit  Medication Sig  . ascorbic acid (VITAMIN C) 500 MG tablet Take 500 mg by mouth daily.  . Cholecalciferol (VITAMIN D3) 5000 UNITS CAPS Take 1 capsule by mouth daily.  Marland Kitchen lisinopril (PRINIVIL,ZESTRIL) 10 MG tablet Take 1 tablet (10 mg total) by mouth daily.  . Probiotic Product (Animas) CAPS Take 1 capsule by mouth daily.  . RABEprazole (ACIPHEX) 20 MG tablet Take 1 tablet (20 mg total) by mouth daily.  . simvastatin (ZOCOR) 20 MG tablet Take 1 tablet (20 mg total) by mouth at bedtime.  . vitamin B-12 (CYANOCOBALAMIN) 100 MCG tablet Take 100 mcg by mouth daily.  . Vitamin D, Ergocalciferol, (DRISDOL) 50000 units CAPS capsule Take 1 capsule (50,000 Units total) by mouth every 7 (seven) days.  . meloxicam (MOBIC) 15 MG tablet Take 1 tablet (15 mg total) by mouth as needed. (Patient not taking: Reported on 02/28/2016)   No current facility-administered medications on file prior to visit.   Allergies  Allergen Reactions  .  Dexilant [Dexlansoprazole]     Severe diarrhea   Past Surgical History  Procedure Laterality Date  . Total abdominal hysterectomy w/ bilateral salpingoophorectomy    . Laparoscopic cholecystectomy    . Abdominal hysterectomy     Social History   Social  History  . Marital Status: Single    Spouse Name: N/A  . Number of Children: 1  . Years of Education: N/A   Occupational History  . Not on file.   Social History Main Topics  . Smoking status: Never Smoker   . Smokeless tobacco: Never Used  . Alcohol Use: No  . Drug Use: No  . Sexual Activity: No   Other Topics Concern  . Not on file   Social History Narrative   Family History  Problem Relation Age of Onset  . Heart disease Mother   . Heart attack Mother 16  . Hyperlipidemia Mother   . Hypertension Mother   . Stroke Mother   . Hypertension Father   . Hypertension Sister   . Hypertension Brother   . Hypertension Sister   . Hypertension Sister   . Colon cancer Other   . Breast cancer Other   . Diabetes Other   . Breast cancer Maternal Aunt   . Breast cancer Maternal Aunt        Objective:   Physical Exam  Constitutional: She is oriented to person, place, and time. She appears well-developed and well-nourished.  Abdominal: Soft. Bowel sounds are normal. There is tenderness.  Neurological: She is alert and oriented to person, place, and time.  Psychiatric: She has a normal mood and affect. Her behavior is normal. Judgment and thought content normal.      Assessment & Plan:  1. Pelvic pain in female New problem. Unclear if it is ovarian pain. UA negative for infection. Will order pelvis US.  Results for orders placed or performed in visit on 02/28/16  POCT urinalysis dipstick  Result Value Ref Range   Color, UA Yellow    Clarity, UA Clear    Glucose, UA Negative    Bilirubin, UA Negative    Ketones, UA Negative    Spec Grav, UA 1.015    Blood, UA NH Trace    pH, UA 6.5    Protein, UA Negative    Urobilinogen, UA 0.2    Nitrite, UA Negative    Leukocytes, UA Negative Negative    - POCT urinalysis dipstick - US Pelvis Complete; Future  2. Hematuria UA showed a trace of blood. Will send for UA. - Urinalysis, Routine w reflex microscopic (not at  [facility])  3. Menopausal symptoms Unsure of pt is postmenopausal vs perimenopausal. Will order labs. - [place] - LH  4. Abdominal pain, unspecified abdominal location FU pending results. - CBC with Differential/Platelet - Comprehensive metabolic panel    Patient seen and examined by Jerrell Belfast, MD, and note scribed by Renaldo Fiddler, CMA.  I have reviewed the document for accuracy and completeness and I agree with above. Jerrell Belfast, MD  Margarita Rana, MD

## 2016-02-29 ENCOUNTER — Telehealth: Payer: Self-pay

## 2016-02-29 LAB — URINALYSIS, ROUTINE W REFLEX MICROSCOPIC
Bilirubin, UA: NEGATIVE
GLUCOSE, UA: NEGATIVE
KETONES UA: NEGATIVE
Leukocytes, UA: NEGATIVE
NITRITE UA: NEGATIVE
PROTEIN UA: NEGATIVE
RBC, UA: NEGATIVE
SPEC GRAV UA: 1.018 (ref 1.005–1.030)
UUROB: 0.2 mg/dL (ref 0.2–1.0)
pH, UA: 6.5 (ref 5.0–7.5)

## 2016-02-29 LAB — COMPREHENSIVE METABOLIC PANEL
A/G RATIO: 1.8 (ref 1.2–2.2)
ALK PHOS: 56 IU/L (ref 39–117)
ALT: 24 IU/L (ref 0–32)
AST: 18 IU/L (ref 0–40)
Albumin: 4.9 g/dL (ref 3.5–5.5)
BILIRUBIN TOTAL: 0.3 mg/dL (ref 0.0–1.2)
BUN/Creatinine Ratio: 14 (ref 9–23)
BUN: 15 mg/dL (ref 6–24)
CHLORIDE: 99 mmol/L (ref 96–106)
CO2: 24 mmol/L (ref 18–29)
Calcium: 10.2 mg/dL (ref 8.7–10.2)
Creatinine, Ser: 1.11 mg/dL — ABNORMAL HIGH (ref 0.57–1.00)
GFR calc Af Amer: 66 mL/min/{1.73_m2} (ref >59)
GFR calc non Af Amer: 57 mL/min/{1.73_m2} — ABNORMAL LOW (ref >59)
GLUCOSE: 74 mg/dL (ref 65–99)
Globulin, Total: 2.8 g/dL (ref 1.5–4.5)
POTASSIUM: 4.4 mmol/L (ref 3.5–5.2)
Sodium: 141 mmol/L (ref 134–144)
Total Protein: 7.7 g/dL (ref 6.0–8.5)

## 2016-02-29 LAB — CBC WITH DIFFERENTIAL/PLATELET
BASOS ABS: 0 10*3/uL (ref 0.0–0.2)
Basos: 1 %
EOS (ABSOLUTE): 0.1 10*3/uL (ref 0.0–0.4)
Eos: 1 %
Hematocrit: 37.6 % (ref 34.0–46.6)
Hemoglobin: 12.1 g/dL (ref 11.1–15.9)
Immature Grans (Abs): 0 10*3/uL (ref 0.0–0.1)
Immature Granulocytes: 0 %
LYMPHS ABS: 4 10*3/uL — AB (ref 0.7–3.1)
LYMPHS: 63 %
MCH: 24.9 pg — AB (ref 26.6–33.0)
MCHC: 32.2 g/dL (ref 31.5–35.7)
MCV: 78 fL — AB (ref 79–97)
MONOS ABS: 0.3 10*3/uL (ref 0.1–0.9)
Monocytes: 5 %
NEUTROS ABS: 1.9 10*3/uL (ref 1.4–7.0)
Neutrophils: 30 %
PLATELETS: 270 10*3/uL (ref 150–379)
RBC: 4.85 x10E6/uL (ref 3.77–5.28)
RDW: 15 % (ref 12.3–15.4)
WBC: 6.4 10*3/uL (ref 3.4–10.8)

## 2016-02-29 LAB — FOLLICLE STIMULATING HORMONE: FSH: 31.9 m[IU]/mL

## 2016-02-29 LAB — LUTEINIZING HORMONE: LH: 18.5 m[IU]/mL

## 2016-02-29 NOTE — Telephone Encounter (Signed)
-----   Message from Margarita Rana, MD sent at 02/29/2016  7:26 AM EDT ----- Labs stable. Consistent with post-menopausal.  Proceed with ultrasound as discussed.  Thanks.

## 2016-02-29 NOTE — Telephone Encounter (Signed)
Pt advised.   Thanks,   -Asmara Backs  

## 2016-03-04 ENCOUNTER — Ambulatory Visit
Admission: RE | Admit: 2016-03-04 | Discharge: 2016-03-04 | Disposition: A | Discharge status: Home / Self Care | Payer: 59 | Source: Ambulatory Visit | Attending: Family Medicine | Admitting: Family Medicine

## 2016-03-04 DIAGNOSIS — R102 Pelvic and perineal pain: Secondary | ICD-10-CM | POA: Diagnosis not present

## 2016-03-04 DIAGNOSIS — Z90710 Acquired absence of both cervix and uterus: Secondary | ICD-10-CM | POA: Insufficient documentation

## 2016-03-04 DIAGNOSIS — N83291 Other ovarian cyst, right side: Secondary | ICD-10-CM | POA: Insufficient documentation

## 2016-03-04 DIAGNOSIS — N83201 Unspecified ovarian cyst, right side: Secondary | ICD-10-CM | POA: Diagnosis not present

## 2016-03-05 ENCOUNTER — Telehealth: Payer: Self-pay

## 2016-03-05 NOTE — Telephone Encounter (Signed)
Patient advised as below. Patient reports that she is feeling much better.

## 2016-03-05 NOTE — Telephone Encounter (Signed)
-----   Message from Margarita Rana, MD sent at 03/05/2016 10:37 AM EDT ----- Benign cyst on right ovary,  Unable to visualize left. Otherwise normal ultrasound. Please see how patient is doing. Thanks.

## 2016-03-13 ENCOUNTER — Other Ambulatory Visit: Payer: Self-pay | Admitting: Family Medicine

## 2016-03-13 DIAGNOSIS — E559 Vitamin D deficiency, unspecified: Secondary | ICD-10-CM

## 2016-04-24 ENCOUNTER — Encounter: Payer: 59 | Admitting: Family Medicine

## 2016-04-24 ENCOUNTER — Ambulatory Visit (INDEPENDENT_AMBULATORY_CARE_PROVIDER_SITE_OTHER): Payer: 59 | Admitting: Physician Assistant

## 2016-04-24 ENCOUNTER — Encounter: Payer: Self-pay | Admitting: Physician Assistant

## 2016-04-24 VITALS — BP 114/80 | HR 79 | Temp 98.2°F | Resp 16 | Ht 72.0 in | Wt 248.4 lb

## 2016-04-24 DIAGNOSIS — Z0000 Encounter for general adult medical examination without abnormal findings: Secondary | ICD-10-CM

## 2016-04-24 DIAGNOSIS — R748 Abnormal levels of other serum enzymes: Secondary | ICD-10-CM

## 2016-04-24 DIAGNOSIS — E049 Nontoxic goiter, unspecified: Secondary | ICD-10-CM

## 2016-04-24 DIAGNOSIS — Z1159 Encounter for screening for other viral diseases: Secondary | ICD-10-CM

## 2016-04-24 DIAGNOSIS — Z1239 Encounter for other screening for malignant neoplasm of breast: Secondary | ICD-10-CM

## 2016-04-24 DIAGNOSIS — E119 Type 2 diabetes mellitus without complications: Secondary | ICD-10-CM | POA: Diagnosis not present

## 2016-04-24 DIAGNOSIS — E010 Iodine-deficiency related diffuse (endemic) goiter: Secondary | ICD-10-CM

## 2016-04-24 DIAGNOSIS — R7303 Prediabetes: Secondary | ICD-10-CM | POA: Diagnosis not present

## 2016-04-24 DIAGNOSIS — E559 Vitamin D deficiency, unspecified: Secondary | ICD-10-CM

## 2016-04-24 DIAGNOSIS — R7989 Other specified abnormal findings of blood chemistry: Secondary | ICD-10-CM

## 2016-04-24 DIAGNOSIS — E7800 Pure hypercholesterolemia, unspecified: Secondary | ICD-10-CM | POA: Diagnosis not present

## 2016-04-24 NOTE — Patient Instructions (Signed)
Health Maintenance, Female Adopting a healthy lifestyle and getting preventive care can go a long way to promote health and wellness. Talk with your health care provider about what schedule of regular examinations is right for you. This is a good chance for you to check in with your provider about disease prevention and staying healthy. In between checkups, there are plenty of things you can do on your own. Experts have done a lot of research about which lifestyle changes and preventive measures are most likely to keep you healthy. Ask your health care provider for more information. WEIGHT AND DIET  Eat a healthy diet  Be sure to include plenty of vegetables, fruits, low-fat dairy products, and lean protein.  Do not eat a lot of foods high in solid fats, added sugars, or salt.  Get regular exercise. This is one of the most important things you can do for your health.  Most adults should exercise for at least 150 minutes each week. The exercise should increase your heart rate and make you sweat (moderate-intensity exercise).  Most adults should also do strengthening exercises at least twice a week. This is in addition to the moderate-intensity exercise.  Maintain a healthy weight  Body mass index (BMI) is a measurement that can be used to identify possible weight problems. It estimates body fat based on height and weight. Your health care provider can help determine your BMI and help you achieve or maintain a healthy weight.  For females 20 years of age and older:   A BMI below 18.5 is considered underweight.  A BMI of 18.5 to 24.9 is normal.  A BMI of 25 to 29.9 is considered overweight.  A BMI of 30 and above is considered obese.  Watch levels of cholesterol and blood lipids  You should start having your blood tested for lipids and cholesterol at 54 years of age, then have this test every 5 years.  You may need to have your cholesterol levels checked more often if:  Your lipid  or cholesterol levels are high.  You are older than 54 years of age.  You are at high risk for heart disease.  CANCER SCREENING   Lung Cancer  Lung cancer screening is recommended for adults 55-80 years old who are at high risk for lung cancer because of a history of smoking.  A yearly low-dose CT scan of the lungs is recommended for people who:  Currently smoke.  Have quit within the past 15 years.  Have at least a 30-pack-year history of smoking. A pack year is smoking an average of one pack of cigarettes a day for 1 year.  Yearly screening should continue until it has been 15 years since you quit.  Yearly screening should stop if you develop a health problem that would prevent you from having lung cancer treatment.  Breast Cancer  Practice breast self-awareness. This means understanding how your breasts normally appear and feel.  It also means doing regular breast self-exams. Let your health care provider know about any changes, no matter how small.  If you are in your 20s or 30s, you should have a clinical breast exam (CBE) by a health care provider every 1-3 years as part of a regular health exam.  If you are 40 or older, have a CBE every year. Also consider having a breast X-ray (mammogram) every year.  If you have a family history of breast cancer, talk to your health care provider about genetic screening.  If you   are at high risk for breast cancer, talk to your health care provider about having an MRI and a mammogram every year.  Breast cancer gene (BRCA) assessment is recommended for women who have family members with BRCA-related cancers. BRCA-related cancers include:  Breast.  Ovarian.  Tubal.  Peritoneal cancers.  Results of the assessment will determine the need for genetic counseling and BRCA1 and BRCA2 testing. Cervical Cancer Your health care provider may recommend that you be screened regularly for cancer of the pelvic organs (ovaries, uterus, and  vagina). This screening involves a pelvic examination, including checking for microscopic changes to the surface of your cervix (Pap test). You may be encouraged to have this screening done every 3 years, beginning at age 21.  For women ages 30-65, health care providers may recommend pelvic exams and Pap testing every 3 years, or they may recommend the Pap and pelvic exam, combined with testing for human papilloma virus (HPV), every 5 years. Some types of HPV increase your risk of cervical cancer. Testing for HPV may also be done on women of any age with unclear Pap test results.  Other health care providers may not recommend any screening for nonpregnant women who are considered low risk for pelvic cancer and who do not have symptoms. Ask your health care provider if a screening pelvic exam is right for you.  If you have had past treatment for cervical cancer or a condition that could lead to cancer, you need Pap tests and screening for cancer for at least 20 years after your treatment. If Pap tests have been discontinued, your risk factors (such as having a new sexual partner) need to be reassessed to determine if screening should resume. Some women have medical problems that increase the chance of getting cervical cancer. In these cases, your health care provider may recommend more frequent screening and Pap tests. Colorectal Cancer  This type of cancer can be detected and often prevented.  Routine colorectal cancer screening usually begins at 54 years of age and continues through 54 years of age.  Your health care provider may recommend screening at an earlier age if you have risk factors for colon cancer.  Your health care provider may also recommend using home test kits to check for hidden blood in the stool.  A small camera at the end of a tube can be used to examine your colon directly (sigmoidoscopy or colonoscopy). This is done to check for the earliest forms of colorectal  cancer.  Routine screening usually begins at age 50.  Direct examination of the colon should be repeated every 5-10 years through 54 years of age. However, you may need to be screened more often if early forms of precancerous polyps or small growths are found. Skin Cancer  Check your skin from head to toe regularly.  Tell your health care provider about any new moles or changes in moles, especially if there is a change in a mole's shape or color.  Also tell your health care provider if you have a mole that is larger than the size of a pencil eraser.  Always use sunscreen. Apply sunscreen liberally and repeatedly throughout the day.  Protect yourself by wearing long sleeves, pants, a wide-brimmed hat, and sunglasses whenever you are outside. HEART DISEASE, DIABETES, AND HIGH BLOOD PRESSURE   High blood pressure causes heart disease and increases the risk of stroke. High blood pressure is more likely to develop in:  People who have blood pressure in the high end   of the normal range (130-139/85-89 mm Hg).  People who are overweight or obese.  People who are African American.  If you are 38-23 years of age, have your blood pressure checked every 3-5 years. If you are 61 years of age or older, have your blood pressure checked every year. You should have your blood pressure measured twice--once when you are at a hospital or clinic, and once when you are not at a hospital or clinic. Record the average of the two measurements. To check your blood pressure when you are not at a hospital or clinic, you can use:  An automated blood pressure machine at a pharmacy.  A home blood pressure monitor.  If you are between 45 years and 39 years old, ask your health care provider if you should take aspirin to prevent strokes.  Have regular diabetes screenings. This involves taking a blood sample to check your fasting blood sugar level.  If you are at a normal weight and have a low risk for diabetes,  have this test once every three years after 54 years of age.  If you are overweight and have a high risk for diabetes, consider being tested at a younger age or more often. PREVENTING INFECTION  Hepatitis B  If you have a higher risk for hepatitis B, you should be screened for this virus. You are considered at high risk for hepatitis B if:  You were born in a country where hepatitis B is common. Ask your health care provider which countries are considered high risk.  Your parents were born in a high-risk country, and you have not been immunized against hepatitis B (hepatitis B vaccine).  You have HIV or AIDS.  You use needles to inject street drugs.  You live with someone who has hepatitis B.  You have had sex with someone who has hepatitis B.  You get hemodialysis treatment.  You take certain medicines for conditions, including cancer, organ transplantation, and autoimmune conditions. Hepatitis C  Blood testing is recommended for:  Everyone born from [birth year] through 1964.  Anyone with known risk factors for hepatitis C. Sexually transmitted infections (STIs)  You should be screened for sexually transmitted infections (STIs) including gonorrhea and chlamydia if:  You are sexually active and are younger than 54 years of age.  You are older than 53 years of age and your health care provider tells you that you are at risk for this type of infection.  Your sexual activity has changed since you were last screened and you are at an increased risk for chlamydia or gonorrhea. Ask your health care provider if you are at risk.  If you do not have HIV, but are at risk, it may be recommended that you take a prescription medicine daily to prevent HIV infection. This is called pre-exposure prophylaxis (PrEP). You are considered at risk if:  You are sexually active and do not regularly use condoms or know the HIV status of your partner(s).  You take drugs by injection.  You are sexually  active with a partner who has HIV. Talk with your health care provider about whether you are at high risk of being infected with HIV. If you choose to begin PrEP, you should first be tested for HIV. You should then be tested every 3 months for as long as you are taking PrEP.  PREGNANCY   If you are premenopausal and you may become pregnant, ask your health care provider about preconception counseling.  If you may  become pregnant, take 400 to 800 micrograms (mcg) of folic acid every day.  If you want to prevent pregnancy, talk to your health care provider about birth control (contraception). OSTEOPOROSIS AND MENOPAUSE   Osteoporosis is a disease in which the bones lose minerals and strength with aging. This can result in serious bone fractures. Your risk for osteoporosis can be identified using a bone density scan.  If you are 61 years of age or older, or if you are at risk for osteoporosis and fractures, ask your health care provider if you should be screened.  Ask your health care provider whether you should take a calcium or vitamin D supplement to lower your risk for osteoporosis.  Menopause may have certain physical symptoms and risks.  Hormone replacement therapy may reduce some of these symptoms and risks. Talk to your health care provider about whether hormone replacement therapy is right for you.  HOME CARE INSTRUCTIONS   Schedule regular health, dental, and eye exams.  Stay current with your immunizations.   Do not use any tobacco products including cigarettes, chewing tobacco, or electronic cigarettes.  If you are pregnant, do not drink alcohol.  If you are breastfeeding, limit how much and how often you drink alcohol.  Limit alcohol intake to no more than 1 drink per day for nonpregnant women. One drink equals 12 ounces of beer, 5 ounces of wine, or 1 ounces of hard liquor.  Do not use street drugs.  Do not share needles.  Ask your health care provider for help if  you need support or information about quitting drugs.  Tell your health care provider if you often feel depressed.  Tell your health care provider if you have ever been abused or do not feel safe at home.   This information is not intended to replace advice given to you by your health care provider. Make sure you discuss any questions you have with your health care provider.   Document Released: 03/18/2011 Document Revised: 09/23/2014 Document Reviewed: 08/04/2013 Elsevier Interactive Patient Education [employer].

## 2016-04-24 NOTE — Progress Notes (Signed)
Patient: Alexandra Mora, Female    DOB: 1960/12/11, 55 y.o.   MRN: [id number] Visit Date: 04/24/2016  Today's Provider: Mar Daring, PA-C   Chief Complaint  Patient presents with  . Annual Exam   Subjective:    Annual physical exam Alexandra Mora is a 55 y.o. female who presents today for health maintenance and complete physical. She feels fairly well. 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 She reports she is sleeping poorly. She feels this is most closely related to stress from her job (She is an Rn in orthopedics). This has been an ongoing issue and is not new, nor has it changed.   Last PCP:03/29/15 Mammogram: 08/16/15 Normal Colonoscopy:02/14/2014-Repeat in 2018; Pre-cancerous polyps with positive family history. Tdap: 01/19/2014 Pap Smear: 01/19/14 Negative -----------------------------------------------------------------   Review of Systems  Constitutional: Negative.   HENT: Negative.   Eyes: Negative.   Respiratory: Negative.   Cardiovascular: Negative.   Gastrointestinal: Positive for constipation (due to not drinking enough water at work) and rectal pain.  Endocrine: Negative.   Genitourinary: Negative.   Musculoskeletal: Negative.   Skin: Negative.   Allergic/Immunologic: Negative.   Neurological: Negative.   Hematological: Negative.   Psychiatric/Behavioral: Positive for sleep disturbance.    Social History      She  reports that she has never smoked. She has never used smokeless tobacco. She reports that she does not drink alcohol or use drugs.       Social History   Social History  . Marital status: Single    Spouse name: N/A  . Number of children: 1  . Years of education: N/A   Social History Main Topics  . Smoking status: Never Smoker  . Smokeless tobacco: Never Used  . Alcohol use No  . Drug use: No  . Sexual activity: No   Other Topics Concern  . None   Social History Narrative  . None     Past Medical History:  Diagnosis Date  . Benign paroxysmal positional vertigo   . Diabetes mellitus without complication ([place])   . Elevated CK   . GERD (gastroesophageal reflux disease)   . Hypercholesteremia   . Hyperglycemia   . Hypertension   . Insomnia   . Menopausal disorder      Patient Active Problem List   Diagnosis Date Noted  . Low back pain 10/04/2015  . Diabetes type 2, controlled ([place]) 07/05/2015  . Hematuria 03/29/2015  . Acute stress disorder 01/25/2015  . Benign paroxysmal positional nystagmus 01/25/2015  . Big thyroid 01/25/2015  . BP (high blood pressure) 01/25/2015  . Low serum cobalamin 01/25/2015  . Avitaminosis D 01/25/2015  . GERD (gastroesophageal reflux disease) 01/31/2014  . Family history of coronary artery disease 01/31/2014  . Shoulder pain 01/31/2014  . Abnormal EKG 01/31/2014  . Insomnia 03/17/2008  . Family history of colon cancer 09/16/2007  . Hypercholesteremia 01/05/2007    Past Surgical History:  Procedure Laterality Date  . ABDOMINAL HYSTERECTOMY    . LAPAROSCOPIC CHOLECYSTECTOMY    . TOTAL ABDOMINAL HYSTERECTOMY W/ BILATERAL SALPINGOOPHORECTOMY      Family History        Family Status  Relation Status  . Mother Alive   stroke  . Father Alive   diabetes  . Sister Alive  . Brother Alive  . Sister Alive  . Sister Alive  . Other   . Maternal Aunt   .  Maternal Aunt         Her family history includes Breast cancer in her maternal aunt, maternal aunt, and other; Colon cancer in her other; Diabetes in her other; Heart attack (age of onset: 37) in her mother; Heart disease in her mother; Hyperlipidemia in her mother; Hypertension in her brother, father, mother, sister, sister, and sister; Stroke in her mother.    Allergies  Allergen Reactions  . Dexilant [Dexlansoprazole]     Severe diarrhea    Current Meds  Medication Sig  . ascorbic acid (VITAMIN C) 500 MG tablet Take 500 mg by mouth daily.  . Cholecalciferol  (VITAMIN D3) 5000 UNITS CAPS Take 1 capsule by mouth daily.  Marland Kitchen lisinopril (PRINIVIL,ZESTRIL) 10 MG tablet Take 1 tablet (10 mg total) by mouth daily.  . meloxicam (MOBIC) 15 MG tablet Take 1 tablet (15 mg total) by mouth as needed.  . Probiotic Product (Elrosa) CAPS Take 1 capsule by mouth daily.  . RABEprazole (ACIPHEX) 20 MG tablet Take 1 tablet (20 mg total) by mouth daily.  . simvastatin (ZOCOR) 20 MG tablet Take 1 tablet (20 mg total) by mouth at bedtime.  . vitamin B-12 (CYANOCOBALAMIN) 100 MCG tablet Take 100 mcg by mouth daily.  . Vitamin D, Ergocalciferol, (DRISDOL) 50000 units CAPS capsule TAKE 1 CAPSULE BY MOUTH EVERY 7 DAYS.    Patient Care Team: Margarita Rana, MD as PCP - General (Family Medicine)     Objective:   Vitals: BP 114/80 (BP Location: Left Arm, Patient Position: Sitting, Cuff Size: Normal)   Pulse 79   Temp 98.2 F (36.8 C) (Oral)   Resp 16   Ht 6' (1.829 m)   Wt 248 lb 6.4 oz (112.7 kg)   BMI 33.69 kg/m    Physical Exam  Constitutional: She is oriented to person, place, and time. She appears well-developed and well-nourished. No distress.  HENT:  Head: Normocephalic and atraumatic.  Right Ear: Hearing, tympanic membrane, external ear and ear canal normal.  Left Ear: Hearing, tympanic membrane, external ear and ear canal normal.  Nose: Nose normal.  Mouth/Throat: Uvula is midline, oropharynx is clear and moist and mucous membranes are normal. No oropharyngeal exudate.  Eyes: Conjunctivae and EOM are normal. Pupils are equal, round, and reactive to light. Right eye exhibits no discharge. Left eye exhibits no discharge. No scleral icterus.  Neck: Normal range of motion. Neck supple. No JVD present. No tracheal deviation present. No thyromegaly present.  Cardiovascular: Normal rate, regular rhythm, normal heart sounds and intact distal pulses.  Exam reveals no gallop and no friction rub.   No murmur heard. Pulmonary/Chest: Effort normal and  breath sounds normal. No respiratory distress. She has no wheezes. She has no rales. She exhibits no tenderness. Right breast exhibits no inverted nipple, no mass, no nipple discharge, no skin change and no tenderness. Left breast exhibits no inverted nipple, no mass, no nipple discharge, no skin change and no tenderness. Breasts are symmetrical.  Abdominal: Soft. Bowel sounds are normal. She exhibits no distension and no mass. There is no tenderness. There is no rebound and no guarding.  Musculoskeletal: Normal range of motion. She exhibits no edema or tenderness.  Lymphadenopathy:    She has no cervical adenopathy.  Neurological: She is alert and oriented to person, place, and time.  Skin: Skin is warm and dry. No rash noted. She is not diaphoretic.  Psychiatric: She has a normal mood and affect. Her behavior is normal. Judgment and thought  content normal.  Vitals reviewed.    Depression Screen PHQ 2/9 Scores 04/24/2016 03/29/2015  PHQ - 2 Score 0 0      Assessment & Plan:     Routine Health Maintenance and Physical Exam  Exercise Activities and Dietary recommendations Goals    . Exercise 150 minutes per week (moderate activity)       Immunization History  Administered Date(s) Administered  . Influenza,inj,Quad PF,36+ Mos 06/17/2015  . Tdap 01/19/2014    Health Maintenance  Topic Date Due  . Hepatitis C Screening  September 30, 1960  . PNEUMOCOCCAL POLYSACCHARIDE VACCINE (1) 08/05/1964  . OPHTHALMOLOGY EXAM  08/05/1972  . HIV Screening  08/05/1977  . HEMOGLOBIN A1C  04/02/2016  . INFLUENZA VACCINE  04/16/2016  . FOOT EXAM  07/04/2016  . PAP SMEAR  01/19/2017  . COLONOSCOPY  02/14/2017  . MAMMOGRAM  08/15/2017  . TETANUS/TDAP  01/20/2024      Discussed health benefits of physical activity, and encouraged her to engage in regular exercise appropriate for her age and condition.   1. Annual physical exam Normal physical exam today. Will check labs as below and f/u pending lab  results. If labs are stable and WNL she will not need to have these rechecked for one year at her next annual physical exam. She is to call the office in the meantime if she has any acute issue, questions or concerns. - TSH  2. Controlled type 2 diabetes mellitus without complication, without long-term current use of insulin (HCC) Stable and diet controlled. Continue healthy diet and physical activity. Will check labs as below and f/u pending results. I willl see her back in 4 months to recheck HgBA1c and do foot exam. - Hemoglobin A1c  3. Hypercholesteremia Continue Simvastatin 20mg . Will check labs as below and f/u pending results. - Lipid panel  4. Big thyroid Will check labs as below and f/u pending results. - TSH  6. Avitaminosis D Continue Vit D supplement. Will check labs since she is on the high dose and f/u pending labs.  - Vitamin D (25 hydroxy)  7. Breast cancer screening Breast exam today was normal. There is no family history of breast cancer. She does perform regular self breast exams. Mammogram was ordered as below. Information for [facility] was given to patient so she may schedule her mammogram at her convenience. - MM DIGITAL SCREENING BILATERAL; Future  8. Need for hepatitis C screening test - Hepatitis C Antibody  9. Elevated serum creatinine Most recent lab check showed a Cr of 1.11. Will check labs as below and f/u pending results. - Basic Metabolic Panel (BMET)  --------------------------------------------------------------------    Mar Daring, PA-C  [facility]

## 2016-04-25 LAB — BASIC METABOLIC PANEL
BUN/Creatinine Ratio: 14 (ref 9–23)
BUN: 14 mg/dL (ref 6–24)
CO2: 28 mmol/L (ref 18–29)
Calcium: 10.3 mg/dL — ABNORMAL HIGH (ref 8.7–10.2)
Chloride: 103 mmol/L (ref 96–106)
Creatinine, Ser: 1.03 mg/dL — ABNORMAL HIGH (ref 0.57–1.00)
GFR, EST AFRICAN AMERICAN: 72 mL/min/{1.73_m2} (ref >59)
GFR, EST NON AFRICAN AMERICAN: 62 mL/min/{1.73_m2} (ref >59)
Glucose: 99 mg/dL (ref 65–99)
POTASSIUM: 4.6 mmol/L (ref 3.5–5.2)
SODIUM: 144 mmol/L (ref 134–144)

## 2016-04-25 LAB — LIPID PANEL
CHOLESTEROL TOTAL: 172 mg/dL (ref 100–199)
Chol/HDL Ratio: 3.4 ratio units (ref 0.0–4.4)
HDL: 50 mg/dL (ref >39)
LDL CALC: 97 mg/dL (ref 0–99)
Triglycerides: 127 mg/dL (ref 0–149)
VLDL CHOLESTEROL CAL: 25 mg/dL (ref 5–40)

## 2016-04-25 LAB — HEMOGLOBIN A1C
ESTIMATED AVERAGE GLUCOSE: 134 mg/dL
Hgb A1c MFr Bld: 6.3 % — ABNORMAL HIGH (ref 4.8–5.6)

## 2016-04-25 LAB — TSH: TSH: 2.73 u[IU]/mL (ref 0.450–4.500)

## 2016-04-25 LAB — VITAMIN D 25 HYDROXY (VIT D DEFICIENCY, FRACTURES): Vit D, 25-Hydroxy: 45.5 ng/mL (ref 30.0–100.0)

## 2016-04-25 LAB — HEPATITIS C ANTIBODY

## 2016-05-22 ENCOUNTER — Ambulatory Visit: Payer: 59 | Admitting: Internal Medicine

## 2016-05-30 ENCOUNTER — Encounter: Payer: Self-pay | Admitting: Physician Assistant

## 2016-05-30 ENCOUNTER — Ambulatory Visit: Payer: Self-pay | Admitting: Physician Assistant

## 2016-05-30 VITALS — BP 130/88 | HR 80 | Temp 98.6°F

## 2016-05-30 DIAGNOSIS — R300 Dysuria: Secondary | ICD-10-CM

## 2016-05-30 DIAGNOSIS — N390 Urinary tract infection, site not specified: Secondary | ICD-10-CM

## 2016-05-30 DIAGNOSIS — R319 Hematuria, unspecified: Secondary | ICD-10-CM

## 2016-05-30 LAB — POCT URINALYSIS DIPSTICK
BILIRUBIN UA: NEGATIVE
Glucose, UA: NEGATIVE
KETONES UA: NEGATIVE
Nitrite, UA: NEGATIVE
PH UA: 6
PROTEIN UA: NEGATIVE
SPEC GRAV UA: 1.015
Urobilinogen, UA: 0.2

## 2016-05-30 MED ORDER — PHENAZOPYRIDINE HCL 200 MG PO TABS: 200.0000 mg | ORAL_TABLET | Freq: Three times a day (TID) | ORAL | 0 refills | Status: DC | PRN

## 2016-05-30 MED ORDER — CIPROFLOXACIN HCL 250 MG PO TABS: 250.0000 mg | ORAL_TABLET | Freq: Two times a day (BID) | ORAL | 0 refills | Status: DC

## 2016-05-30 NOTE — Progress Notes (Signed)
S:  C/o uti sx for 2 days, burning, urgency, frequency, denies vaginal discharge, abdominal pain or flank pain: denies fever, chills;  Remainder ros neg  O:  Vitals wnl, nad, no cva tenderness, back nontender, lungs c t a,cv rrr,  n/v intact ua trace leuks, trace blood  A: uti  P: cipro 250mg  bid x 7d, pyridium, increase water intake, add cranberry juice, return if not improving in 2 -3 days, return earlier if worsening, discussed pyelonephritis sx

## 2016-06-24 ENCOUNTER — Other Ambulatory Visit: Payer: Self-pay | Admitting: Family Medicine

## 2016-06-24 DIAGNOSIS — E7800 Pure hypercholesterolemia, unspecified: Secondary | ICD-10-CM

## 2016-06-24 DIAGNOSIS — I10 Essential (primary) hypertension: Secondary | ICD-10-CM

## 2016-07-17 DIAGNOSIS — H52223 Regular astigmatism, bilateral: Secondary | ICD-10-CM | POA: Diagnosis not present

## 2016-07-17 DIAGNOSIS — H524 Presbyopia: Secondary | ICD-10-CM | POA: Diagnosis not present

## 2016-07-23 ENCOUNTER — Other Ambulatory Visit: Payer: Self-pay | Admitting: Gastroenterology

## 2016-07-23 MED ORDER — PANTOPRAZOLE SODIUM 40 MG PO TBEC: 40.0000 mg | DELAYED_RELEASE_TABLET | Freq: Every day | ORAL | 11 refills | Status: DC

## 2016-08-16 ENCOUNTER — Other Ambulatory Visit: Payer: Self-pay | Admitting: Physician Assistant

## 2016-08-16 ENCOUNTER — Ambulatory Visit
Admission: RE | Admit: 2016-08-16 | Discharge: 2016-08-16 | Disposition: A | Discharge status: Home / Self Care | Payer: 59 | Source: Ambulatory Visit | Attending: Physician Assistant | Admitting: Physician Assistant

## 2016-08-16 DIAGNOSIS — Z1231 Encounter for screening mammogram for malignant neoplasm of breast: Secondary | ICD-10-CM | POA: Diagnosis not present

## 2016-08-16 DIAGNOSIS — Z1239 Encounter for other screening for malignant neoplasm of breast: Secondary | ICD-10-CM

## 2016-08-21 ENCOUNTER — Encounter: Payer: Self-pay | Admitting: Physician Assistant

## 2016-08-21 ENCOUNTER — Ambulatory Visit (INDEPENDENT_AMBULATORY_CARE_PROVIDER_SITE_OTHER): Payer: 59 | Admitting: Physician Assistant

## 2016-08-21 VITALS — BP 160/100 | HR 100 | Temp 99.1°F | Resp 16 | Wt 255.2 lb

## 2016-08-21 DIAGNOSIS — E119 Type 2 diabetes mellitus without complications: Secondary | ICD-10-CM

## 2016-08-21 DIAGNOSIS — N390 Urinary tract infection, site not specified: Secondary | ICD-10-CM | POA: Diagnosis not present

## 2016-08-21 DIAGNOSIS — F5101 Primary insomnia: Secondary | ICD-10-CM | POA: Diagnosis not present

## 2016-08-21 DIAGNOSIS — N951 Menopausal and female climacteric states: Secondary | ICD-10-CM | POA: Diagnosis not present

## 2016-08-21 LAB — POCT URINALYSIS DIPSTICK
BILIRUBIN UA: NEGATIVE
GLUCOSE UA: NEGATIVE
Ketones, UA: NEGATIVE
Leukocytes, UA: NEGATIVE
NITRITE UA: NEGATIVE
Protein, UA: NEGATIVE
SPEC GRAV UA: 1.015
Urobilinogen, UA: 0.2
pH, UA: 6

## 2016-08-21 LAB — POCT GLYCOSYLATED HEMOGLOBIN (HGB A1C)
Est. average glucose Bld gHb Est-mCnc: 140
HEMOGLOBIN A1C: 6.5

## 2016-08-21 MED ORDER — ZOLPIDEM TARTRATE ER 12.5 MG PO TBCR: 12.5000 mg | EXTENDED_RELEASE_TABLET | Freq: Every evening | ORAL | 0 refills | Status: DC | PRN

## 2016-08-21 MED ORDER — PAROXETINE MESYLATE 10 MG PO TABS
5.0000 mg | ORAL_TABLET | ORAL | 0 refills | Status: DC
Start: 2016-08-21 — End: 2016-10-09

## 2016-08-21 NOTE — Patient Instructions (Signed)
Zolpidem extended-release tablets What is this medicine? ZOLPIDEM (zole PI dem) is used to treat insomnia. This medicine helps you to fall asleep and sleep through the night. This medicine may be used for other purposes; ask your health care provider or pharmacist if you have questions. COMMON BRAND NAME(S): Ambien CR What should I tell my health care provider before I take this medicine? They need to know if you have any of these conditions: -depression -history of drug abuse or addiction -if you often drink alcohol -liver disease -lung or breathing disease -myasthenia gravis -sleep apnea -suicidal thoughts, plans, or attempt; a previous suicide attempt by you or a family member -an unusual or allergic reaction to zolpidem, other medicines, foods, dyes, or preservatives -pregnant or trying to get pregnant -breast-feeding How should I use this medicine? Take this medicine by mouth with a glass of water. Follow the directions on the prescription label. Do not crush, split, or chew the tablet before swallowing. It is better to take this medicine on an empty stomach and only when you are ready for bed. Do not take your medicine more often than directed. If you have been taking this medicine for several weeks and suddenly stop taking it, you may get unpleasant withdrawal symptoms. Your doctor or health care professional may want to gradually reduce the dose. Do not stop taking this medicine on your own. Always follow your doctor or health care professional's advice. A special MedGuide will be given to you by the pharmacist with each prescription and refill. Be sure to read this information carefully each time. Talk to your pediatrician regarding the use of this medicine in children. Special care may be needed. Overdosage: If you think you have taken too much of this medicine contact a poison control center or emergency room at once. NOTE: This medicine is only for you. Do not share this medicine  with others. What if I miss a dose? This does not apply. This medicine should only be taken immediately before going to sleep. Do not take double or extra doses. What may interact with this medicine? -alcohol -antihistamines for allergy, cough and cold -certain medicines for anxiety or sleep -certain medicines for depression, like amitriptyline, fluoxetine, sertraline -certain medicines for fungal infections like ketoconazole and itraconazole -certain medicines for seizures like phenobarbital, primidone -ciprofloxacin -dietary supplements for sleep, like valerian or kava kava -general anesthetics like halothane, isoflurane, methoxyflurane, propofol -local anesthetics like lidocaine, pramoxine, tetracaine -medicines that relax muscles for surgery -narcotic medicines for pain -phenothiazines like chlorpromazine, mesoridazine, prochlorperazine, thioridazine -rifampin This list may not describe all possible interactions. Give your health care provider a list of all the medicines, herbs, non-prescription drugs, or dietary supplements you use. Also tell them if you smoke, drink alcohol, or use illegal drugs. Some items may interact with your medicine. What should I watch for while using this medicine? Visit your doctor or health care professional for regular checks on your progress. Keep a regular sleep schedule by going to bed at about the same time each night. Avoid caffeine-containing drinks in the evening hours. When sleep medicines are used every night for more than a few weeks, they may stop working. Talk to your doctor if your insomnia worsens or is not better within 7 to 10 days. After taking this medicine for sleep, you may get up out of bed while not being fully awake and do an activity that you do not know you are doing. The next morning, you may have no memory of   the event. Activities such as driving a car ("sleep-driving"), making and eating food, talking on the phone, sexual activity,  and sleep-walking have been reported. Call your doctor right away if you find out you have done any of these activities. Do not take this medicine if you have used alcohol that evening or before bed or taken another medicine for sleep since your risk of doing these sleep-related activities will be increased. Do not take this medicine unless you are able to stay in bed for a full night (7 to 8 hours) before you must be active again. Do not drive, use machinery, or do anything that needs mental alertness the day after you take this medicine. You may have a decrease in mental alertness the day after use, even if you feel that you are fully awake. Tell your doctor if you will need to perform activities requiring full alertness, such as driving, the next day. Do not stand or sit up quickly, especially if you are an older patient. This reduces the risk of dizzy or fainting spells. If you or your family notice any changes in your moods or behavior, such as new or worsening depression, thoughts of harming yourself, anxiety, other unusual or disturbing thoughts, or memory loss, call your doctor right away. After you stop taking this medicine, you may have trouble falling asleep. This is called rebound insomnia. This problem usually goes away on its own after 1 or 2 nights. What side effects may I notice from receiving this medicine? Side effects that you should report to your doctor or health care professional as soon as possible: -allergic reactions like skin rash, itching or hives, swelling of the face, lips, or tongue -breathing problems -changes in vision -confusion -depressed mood or other changes in moods or emotions -feeling faint or lightheaded, falls -hallucinations -loss of balance or coordination -loss of memory -numbness or tingling of the tongue -restlessness, excitability, or feelings of anxiety or agitation -signs and symptoms of liver injury like dark yellow or brown urine; general ill  feeling or flu-like symptoms; light-colored stools; loss of appetite; nausea; right upper belly pain; unusually weak or tired; yellowing of the eyes or skin -suicidal thoughts -unusual activities while asleep like driving, eating, making phone calls, or sexual activity Side effects that usually do not require medical attention (report to your doctor or health care professional if they continue or are bothersome): -dizziness -drowsiness the day after you take this medicine -headache This list may not describe all possible side effects. Call your doctor for medical advice about side effects. You may report side effects to FDA at 1-800-FDA-1088. Where should I keep my medicine? Keep out of the reach of children. This medicine can be abused. Keep your medicine in a safe place to protect it from theft. Do not share this medicine with anyone. Selling or giving away this medicine is dangerous and against the law. This medicine may cause accidental overdose and death if taken by other adults, children, or pets. Mix any unused medicine with a substance like cat litter or coffee grounds. Then throw the medicine away in a sealed container like a sealed bag or a coffee can with a lid. Do not use the medicine after the expiration date. Store at controlled room temperature between 15 and 25 degrees C (59 and 77 degrees F). NOTE: This sheet is a summary. It may not cover all possible information. If you have questions about this medicine, talk to your doctor, pharmacist, or health care provider.    2017 Elsevier/Gold Standard (2015-12-06 14:31:21)

## 2016-08-21 NOTE — Progress Notes (Signed)
Patient: Alexandra Mora Female    DOB: 1960-12-19   55 y.o.   MRN: [id number] Visit Date: 08/21/2016  Today's Provider: Mar Daring, PA-C   Chief Complaint  Patient presents with  . Follow-up    Diabetes   Subjective:    HPI  Diabetes Mellitus Type II, Follow-up:   Lab Results  Component Value Date   HGBA1C 6.5 08/21/2016   HGBA1C 6.3 (H) 04/24/2016   HGBA1C 6.5 10/04/2015    Last seen for diabetes 4 months ago.  Management since then includes none. She reports excellent compliance with treatment. She is not having side effects.  Current symptoms include chest pain or indigestion and have been unchanged.   She also reports that she is having insomnia and started Clonazepam again.This been a long-standing issue and she has failed multiple treatments. She reports that she has tried immediate release Ambien, belsomra, clonazepam, Xanax, melatonin, trazodone, lexapro. She has failed all treatments. She has never had a sleep study. He reports that she has had insomnia for as long as she can remember but it does wax and wane. She states she has trouble shutting her brain off and when she wakes up her thoughts are racing again. She does report having some increased stress with taking care of her mother and work. She does not report any sleep apnea but does state that she tosses and turns all night long and this keeps her awake as well.  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  Pertinent Labs:    Component Value Date/Time   CHOL 172 04/24/2016 0957   TRIG 127 04/24/2016 0957   HDL 50 04/24/2016 0957   LDLCALC 97 04/24/2016 0957   CREATININE 1.03 (H) 04/24/2016 0957   CREATININE 1.07 07/03/2014 0849    Wt Readings from Last 3 Encounters:  08/21/16 255 lb 3.2 oz (115.8 kg)  04/24/16 248 lb 6.4 oz (112.7 kg)  02/28/16 247 lb (112 kg)   Patient did not take  lisinopril this morning.  ------------------------------------------------------------------------     Allergies  Allergen Reactions  . Dexilant [Dexlansoprazole]     Severe diarrhea     Current Outpatient Prescriptions:  .  ascorbic acid (VITAMIN C) 500 MG tablet, Take 500 mg by mouth daily., Disp: , Rfl:  .  Cholecalciferol (VITAMIN D3) 5000 UNITS CAPS, Take 1 capsule by mouth daily., Disp: , Rfl:  .  lisinopril (PRINIVIL,ZESTRIL) 10 MG tablet, TAKE 1 TABLET BY MOUTH DAILY, Disp: 90 tablet, Rfl: 1 .  meloxicam (MOBIC) 15 MG tablet, Take 1 tablet (15 mg total) by mouth as needed., Disp: 90 tablet, Rfl: 1 .  pantoprazole (PROTONIX) 40 MG tablet, Take 1 tablet (40 mg total) by mouth daily., Disp: 30 tablet, Rfl: 11 .  phenazopyridine (PYRIDIUM) 200 MG tablet, Take 1 tablet (200 mg total) by mouth 3 (three) times daily as needed for pain., Disp: 10 tablet, Rfl: 0 .  Probiotic Product ([place]) CAPS, Take 1 capsule by mouth daily., Disp: , Rfl:  .  simvastatin (ZOCOR) 20 MG tablet, TAKE 1 TABLET BY MOUTH AT BEDTIME, Disp: 90 tablet, Rfl: 1 .  vitamin B-12 (CYANOCOBALAMIN) 100 MCG tablet, Take 100 mcg by mouth daily., Disp: , Rfl:  .  Vitamin D, Ergocalciferol, (DRISDOL) 50000 units CAPS capsule, TAKE 1 CAPSULE BY MOUTH EVERY 7 DAYS., Disp: 12  capsule, Rfl: 1 .  ciprofloxacin (CIPRO) 250 MG tablet, Take 1 tablet (250 mg total) by mouth 2 (two) times daily. (Patient not taking: Reported on 08/21/2016), Disp: 14 tablet, Rfl: 0  Review of Systems  Constitutional: Positive for fatigue.  Respiratory: Negative.   Cardiovascular: Positive for chest pain (She got prescribed protonix by Dr. Allen Norris; improving with treatment). Negative for palpitations and leg swelling.  Gastrointestinal: Negative.   Neurological: Negative.   Psychiatric/Behavioral: Positive for sleep disturbance. Negative for decreased concentration and dysphoric mood. The patient is not nervous/anxious.     Social  History  Substance Use Topics  . Smoking status: Never Smoker  . Smokeless tobacco: Never Used  . Alcohol use No   Objective:   BP (!) 160/100 (BP Location: Right Arm, Patient Position: Sitting, Cuff Size: Normal)   Pulse 100   Temp 99.1 F (37.3 C) (Oral)   Resp 16   Wt 255 lb 3.2 oz (115.8 kg)   BMI 34.61 kg/m   Physical Exam  Constitutional: She appears well-developed and well-nourished. No distress.  HENT:  Head: Normocephalic and atraumatic.  Right Ear: Tympanic membrane, external ear and ear canal normal.  Left Ear: Tympanic membrane, external ear and ear canal normal.  Nose: Nose normal.  Mouth/Throat: Uvula is midline, oropharynx is clear and moist and mucous membranes are normal. No oropharyngeal exudate.  Eyes: Pupils are equal, round, and reactive to light.  Neck: Normal range of motion. Neck supple. No JVD present. Carotid bruit is not present. No tracheal deviation present. No thyromegaly present.  Cardiovascular: Normal rate, regular rhythm and normal heart sounds.  Exam reveals no gallop and no friction rub.   No murmur heard. Pulmonary/Chest: Effort normal and breath sounds normal. No respiratory distress. She has no wheezes. She has no rales.  Lymphadenopathy:    She has no cervical adenopathy.  Skin: She is not diaphoretic.  Psychiatric: She has a normal mood and affect. Her behavior is normal. Judgment and thought content normal.  Vitals reviewed.     Assessment & Plan:     1. Controlled type 2 diabetes mellitus without complication, without long-term current use of insulin (HCC) A1c increased to 6.5. Continue to work on lifestyle modifications. We will check again in 3 months. - POCT glycosylated hemoglobin (Hb A1C)  2. Urinary tract infection without hematuria, site unspecified UA today in the office was within normal limits. - POCT urinalysis dipstick  3. Primary insomnia Worsening insomnia. Has failed multiple treatments. I will try Ambien  controlled release to see if this helps her stay asleep. I will also order a sleep study for further evaluation to make sure that she is not having sleep apnea. She is in agreement with treatment plan. I will see her back in 4 weeks to see how she is doing with the controlled release Ambien and to discuss sleep study results if we have them by that time. - zolpidem (AMBIEN CR) 12.5 MG CR tablet; Take 1 tablet (12.5 mg total) by mouth at bedtime as needed for sleep.  Dispense: 30 tablet; Refill: 0 - Ambulatory referral to Sleep Studies  4. Menopausal syndrome (hot flashes) Worsening hot flashes that she is unable to take hormone replacement therapy secondary to family history of estrogen receptor positive breast cancer. She has tried Lexapro in the past for hot flashes that did not help. She feels hot flashes are worsening and may also be affecting her sleep as well. I will give paroxetine as below for her  to try to see if she notices any improvements with hot flashes. I will see her back in 4 weeks to reevaluate. - paroxetine mesylate (PEXEVA) 10 MG tablet; Take 0.5-1 tablets (5-10 mg total) by mouth every morning.  Dispense: 30 tablet; Refill: 0       Mar Daring, PA-C  [facility]

## 2016-08-28 ENCOUNTER — Ambulatory Visit: Payer: 59 | Admitting: Physician Assistant

## 2016-09-06 ENCOUNTER — Ambulatory Visit: Payer: 59 | Attending: Otolaryngology

## 2016-09-06 DIAGNOSIS — E669 Obesity, unspecified: Secondary | ICD-10-CM | POA: Diagnosis not present

## 2016-09-06 DIAGNOSIS — R0683 Snoring: Secondary | ICD-10-CM | POA: Insufficient documentation

## 2016-09-06 DIAGNOSIS — I10 Essential (primary) hypertension: Secondary | ICD-10-CM | POA: Insufficient documentation

## 2016-09-06 DIAGNOSIS — G4700 Insomnia, unspecified: Secondary | ICD-10-CM | POA: Insufficient documentation

## 2016-09-06 DIAGNOSIS — Z72821 Inadequate sleep hygiene: Secondary | ICD-10-CM | POA: Insufficient documentation

## 2016-09-06 DIAGNOSIS — G4733 Obstructive sleep apnea (adult) (pediatric): Secondary | ICD-10-CM | POA: Insufficient documentation

## 2016-09-10 ENCOUNTER — Other Ambulatory Visit: Payer: Self-pay | Admitting: Family Medicine

## 2016-09-10 NOTE — Telephone Encounter (Signed)
Last ov 16/06/17; last filled 03/13/16. Please review. Thank you. sd

## 2016-10-02 ENCOUNTER — Ambulatory Visit: Payer: 59 | Admitting: Physician Assistant

## 2016-10-09 ENCOUNTER — Ambulatory Visit (INDEPENDENT_AMBULATORY_CARE_PROVIDER_SITE_OTHER): Payer: 59 | Admitting: Physician Assistant

## 2016-10-09 ENCOUNTER — Ambulatory Visit: Payer: 59 | Admitting: Physician Assistant

## 2016-10-09 ENCOUNTER — Encounter: Payer: Self-pay | Admitting: Physician Assistant

## 2016-10-09 VITALS — BP 122/80 | HR 68 | Temp 98.2°F | Resp 16 | Wt 254.0 lb

## 2016-10-09 DIAGNOSIS — R0789 Other chest pain: Secondary | ICD-10-CM | POA: Diagnosis not present

## 2016-10-09 DIAGNOSIS — N951 Menopausal and female climacteric states: Secondary | ICD-10-CM | POA: Diagnosis not present

## 2016-10-09 DIAGNOSIS — F5101 Primary insomnia: Secondary | ICD-10-CM | POA: Diagnosis not present

## 2016-10-09 DIAGNOSIS — K2100 Gastro-esophageal reflux disease with esophagitis, without bleeding: Secondary | ICD-10-CM

## 2016-10-09 MED ORDER — PANTOPRAZOLE SODIUM 40 MG PO TBEC: 40.0000 mg | DELAYED_RELEASE_TABLET | Freq: Two times a day (BID) | ORAL | 5 refills | Status: DC

## 2016-10-09 MED ORDER — ZOLPIDEM TARTRATE ER 12.5 MG PO TBCR: 12.5000 mg | EXTENDED_RELEASE_TABLET | Freq: Every evening | ORAL | 5 refills | Status: DC | PRN

## 2016-10-09 MED ORDER — PAROXETINE MESYLATE 10 MG PO TABS: 5.0000 mg | ORAL_TABLET | ORAL | 11 refills | Status: DC

## 2016-10-09 NOTE — Patient Instructions (Signed)
Helicobacter Pylori Infection Introduction Helicobacter pylori infection is an infection in the stomach that is caused by the Helicobacter pylori (H. pylori) bacteria. This type of bacteria often lives in the lining of the stomach. The infection can cause ulcers and irritation (gastritis) in some people. It is the most common cause of ulcers in the stomach (gastric ulcer) and in the upper part of the intestine (duodenal ulcer). Having this infection may also increase the risk of stomach cancer and a type of white blood cell cancer (lymphoma) that affects the stomach. What are the causes? H. pylori is a type of bacteria that is often found in the stomachs of healthy people. The bacteria may be passed from person to person through contact with stool or saliva. It is not known why some people develop ulcers, gastritis, or cancer from the infection. What increases the risk? This condition is more likely to develop in people who:  Have family members with the infection.  Live with many other people, such as in a dormitory.  Are of African, Hispanic, or Asian descent. What are the signs or symptoms? Most people with this infection do not have symptoms. If you do have symptoms, they may include:  Heartburn.  Stomach pain.  Nausea.  Vomiting.  Blood-tinged vomit.  Loss of appetite.  Bad breath. How is this diagnosed? This condition may be diagnosed based on your symptoms, a physical exam, and various tests. Tests may include:  Blood tests or stool tests to check for the proteins (antibodies) that your body may produce in response to the bacteria. These tests are the best way to confirm the diagnosis.  A breath test to check for the type of gas that the H. pylori bacteria release after breaking down a substance called urea. For the test, you are asked to drink urea. This test is often done after treatment in order to find out if the treatment worked.  A procedure in which a thin, flexible  tube with a tiny camera at the end is placed into your stomach and upper intestine (upper endoscopy). Your health care provider may also take tissue samples (biopsy) to test for H. pylori and cancer. How is this treated? Treatment for this condition usually involves taking a combination of medicines (triple therapy) for a couple of weeks. Triple therapy includes one medicine to reduce the acid in your stomach and two types of antibiotic medicines. Many drug combinations have been approved for treatment. Treatment usually kills the H. pylori and reduces your risk of cancer. You may need to be tested for H. pylori again after treatment. In some cases, the treatment may need to be repeated. Follow these instructions at home:  Take over-the-counter and prescription medicines only as told by your health care provider.  Take your antibiotics as told by your health care provider. Do not stop taking the antibiotics even if you start to feel better.  You can do all your usual activities and eat what you usually do.  Take steps to prevent future infections:  Wash your hands often.  Make sure the food you eat has been properly prepared.  Drink water only from clean sources.  Keep all follow-up visits as told by your health care provider. This is important. Contact a health care provider if:  Your symptoms do not get better.  Your symptoms return after treatment. This information is not intended to replace advice given to you by your health care provider. Make sure you discuss any questions you have  with your health care provider. Document Released: 12/25/2015 Document Revised: 02/08/2016 Document Reviewed: 09/14/2014  2017 Elsevier

## 2016-10-09 NOTE — Progress Notes (Signed)
Patient: Alexandra Mora Female    DOB: February 28, 1961   56 y.o.   MRN: [id number] Visit Date: 10/09/2016  Today's Provider: Mar Daring, PA-C   Chief Complaint  Patient presents with  . Follow-up    insomnia   Subjective:    HPI  Follow up for insomnia  The patient was last seen for this 4 weeks ago. Changes made at last visit include start Ambien.  She reports excellent compliance with treatment. She feels that condition is Improved. She is not having side effects.   Paroxetine 5-10mg  tab was added at last visit as well for menopausal hot flashes. She reports excellent compliance and 100% improvement currently.   She is also continuing to have increasing chest burning in the mid chest substernal. She is not due to see Dr. Allen Norris until June 2018 for repeat colonoscopy. He recently palced her on Protonix. She had some relief but recently symptoms worsened. She increased protonix to BID per Dr. Allen Norris and is still having symptoms. She does have a family h/o CAD in her mother. She also has a personal h/o H. Pylori infection.  ------------------------------------------------------------------------------------    Allergies  Allergen Reactions  . Dexilant [Dexlansoprazole]     Severe diarrhea     Current Outpatient Prescriptions:  .  ascorbic acid (VITAMIN C) 500 MG tablet, Take 500 mg by mouth daily., Disp: , Rfl:  .  Cholecalciferol (VITAMIN D3) 5000 UNITS CAPS, Take 1 capsule by mouth daily., Disp: , Rfl:  .  lisinopril (PRINIVIL,ZESTRIL) 10 MG tablet, TAKE 1 TABLET BY MOUTH DAILY, Disp: 90 tablet, Rfl: 1 .  meloxicam (MOBIC) 15 MG tablet, Take 1 tablet (15 mg total) by mouth as needed., Disp: 90 tablet, Rfl: 1 .  methocarbamol (ROBAXIN) 500 MG tablet, Take 1 tablet by mouth 3 (three) times daily., Disp: , Rfl: 3 .  pantoprazole (PROTONIX) 40 MG tablet, Take 1 tablet (40 mg total) by mouth daily., Disp: 30 tablet, Rfl: 11 .  paroxetine mesylate (PEXEVA) 10 MG tablet,  Take 0.5-1 tablets (5-10 mg total) by mouth every morning., Disp: 30 tablet, Rfl: 0 .  Probiotic Product ([place]) CAPS, Take 1 capsule by mouth daily., Disp: , Rfl:  .  simvastatin (ZOCOR) 20 MG tablet, TAKE 1 TABLET BY MOUTH AT BEDTIME, Disp: 90 tablet, Rfl: 1 .  vitamin B-12 (CYANOCOBALAMIN) 100 MCG tablet, Take 100 mcg by mouth daily., Disp: , Rfl:  .  Vitamin D, Ergocalciferol, (DRISDOL) 50000 units CAPS capsule, TAKE 1 CAPSULE BY MOUTH EVERY 7 DAYS, Disp: 12 capsule, Rfl: 1 .  zolpidem (AMBIEN CR) 12.5 MG CR tablet, Take 1 tablet (12.5 mg total) by mouth at bedtime as needed for sleep., Disp: 30 tablet, Rfl: 0  Review of Systems  Constitutional: Negative.   Respiratory: Negative.   Cardiovascular: Positive for chest pain (epigastric burning).  Gastrointestinal: Negative.        Heart burn  Genitourinary: Negative.   Neurological: Negative.     Social History  Substance Use Topics  . Smoking status: Never Smoker  . Smokeless tobacco: Never Used  . Alcohol use No   Objective:   BP 122/80 (BP Location: Left Arm, Patient Position: Sitting, Cuff Size: Large)   Pulse 68   Temp 98.2 F (36.8 C) (Oral)   Resp 16   Wt 254 lb (115.2 kg)   SpO2 99%   BMI 34.45 kg/m   Physical Exam  Constitutional: She appears well-developed and well-nourished. No  distress.  Neck: Normal range of motion. Neck supple.  Cardiovascular: Normal rate, regular rhythm and normal heart sounds.  Exam reveals no gallop and no friction rub.   No murmur heard. Pulmonary/Chest: Effort normal and breath sounds normal. No respiratory distress. She has no wheezes. She has no rales.  Skin: She is not diaphoretic.  Vitals reviewed.      Assessment & Plan:     1. Chest discomfort EKG stable and unchanged from EKG in 2015. NSR rate 84. Will obtain urea breath test to check for H.Pylori since she does have history and is having residual reflux symptoms with protonix 40mg  BID. If h.pylori positive I  will send in antibiotic regimen for her. She is also to notify Dr. Allen Norris for consideration of EGD with colonoscopy.  - EKG 12-Lead - H. pylori breath test  2. Gastroesophageal reflux disease with esophagitis See above medical treatment plan. - H. pylori breath test - pantoprazole (PROTONIX) 40 MG tablet; Take 1 tablet (40 mg total) by mouth 2 (two) times daily.  Dispense: 60 tablet; Refill: 5  3. Primary insomnia Stable. Diagnosis pulled for medication refill. Continue current medical treatment plan. - zolpidem (AMBIEN CR) 12.5 MG CR tablet; Take 1 tablet (12.5 mg total) by mouth at bedtime as needed for sleep.  Dispense: 30 tablet; Refill: 5  4. Menopausal syndrome (hot flashes) Stable. Diagnosis pulled for medication refill. Continue current medical treatment plan. - paroxetine mesylate (PEXEVA) 10 MG tablet; Take 0.5-1 tablets (5-10 mg total) by mouth every morning.  Dispense: 30 tablet; Refill: Warner, PA-C  [facility]

## 2016-10-12 LAB — H.PYLORI BREATH TEST (REFLEX): H. PYLORI BREATH TEST: NEGATIVE

## 2016-10-12 LAB — H. PYLORI BREATH TEST

## 2016-10-16 ENCOUNTER — Encounter: Payer: Self-pay | Admitting: Physician Assistant

## 2016-11-04 ENCOUNTER — Other Ambulatory Visit: Payer: Self-pay

## 2016-11-04 ENCOUNTER — Encounter: Payer: Self-pay | Admitting: Gastroenterology

## 2016-11-04 ENCOUNTER — Ambulatory Visit (INDEPENDENT_AMBULATORY_CARE_PROVIDER_SITE_OTHER): Payer: 59 | Admitting: Gastroenterology

## 2016-11-04 VITALS — BP 145/71 | HR 83 | Temp 98.2°F | Ht 72.0 in | Wt 252.0 lb

## 2016-11-04 DIAGNOSIS — K219 Gastro-esophageal reflux disease without esophagitis: Secondary | ICD-10-CM | POA: Diagnosis not present

## 2016-11-04 DIAGNOSIS — K582 Mixed irritable bowel syndrome: Secondary | ICD-10-CM

## 2016-11-04 MED ORDER — DICYCLOMINE HCL 20 MG PO TABS: 20.0000 mg | ORAL_TABLET | Freq: Three times a day (TID) | ORAL | 2 refills | Status: DC

## 2016-11-04 NOTE — Progress Notes (Signed)
Primary Care Physician: Mar Daring, PA-C  Primary Gastroenterologist:  Dr. Lucilla Lame  Chief Complaint  Patient presents with  . Gastroesophageal Reflux    HPI: Alexandra Mora is a 56 y.o. female here who comes here today with a report of continued GI symptoms. The patient reports that she has abdominal bloating with the need to run to the bathroom 45 minutes after she eats any meal.  The patient denies any black stools or bloody stools.  She also denies any unexplained weight loss.  The patient has been taking Protonix twice a day and continues to have symptoms of acid reflux with burning in her chest.  She also has a lot of burping. The patient reports that she has been having alternating diarrhea and constipation with her bloating.  Current Outpatient Prescriptions  Medication Sig Dispense Refill  . ascorbic acid (VITAMIN C) 500 MG tablet Take 500 mg by mouth daily.    . Cholecalciferol (VITAMIN D3) 5000 UNITS CAPS Take 1 capsule by mouth daily.    Marland Kitchen lisinopril (PRINIVIL,ZESTRIL) 10 MG tablet TAKE 1 TABLET BY MOUTH DAILY 90 tablet 1  . meloxicam (MOBIC) 15 MG tablet Take 1 tablet (15 mg total) by mouth as needed. 90 tablet 1  . methocarbamol (ROBAXIN) 500 MG tablet Take 1 tablet by mouth 3 (three) times daily.  3  . pantoprazole (PROTONIX) 40 MG tablet Take 1 tablet (40 mg total) by mouth 2 (two) times daily. 60 tablet 5  . paroxetine mesylate (PEXEVA) 10 MG tablet Take 0.5-1 tablets (5-10 mg total) by mouth every morning. 30 tablet 11  . Probiotic Product ([place]) CAPS Take 1 capsule by mouth daily.    . simvastatin (ZOCOR) 20 MG tablet TAKE 1 TABLET BY MOUTH AT BEDTIME 90 tablet 1  . vitamin B-12 (CYANOCOBALAMIN) 100 MCG tablet Take 100 mcg by mouth daily.    . Vitamin D, Ergocalciferol, (DRISDOL) 50000 units CAPS capsule TAKE 1 CAPSULE BY MOUTH EVERY 7 DAYS 12 capsule 1  . zolpidem (AMBIEN CR) 12.5 MG CR tablet Take 1 tablet (12.5 mg total) by mouth at bedtime  as needed for sleep. 30 tablet 5  . dicyclomine (BENTYL) 20 MG tablet Take 1 tablet (20 mg total) by mouth 3 (three) times daily before meals. 90 tablet 2   No current facility-administered medications for this visit.     Allergies as of 11/04/2016 - Review Complete 11/04/2016  Allergen Reaction Noted  . Dexilant [dexlansoprazole]  01/31/2014    ROS:  General: Negative for anorexia, weight loss, fever, chills, fatigue, weakness. ENT: Negative for hoarseness, difficulty swallowing , nasal congestion. CV: Negative for chest pain, angina, palpitations, dyspnea on exertion, peripheral edema.  Respiratory: Negative for dyspnea at rest, dyspnea on exertion, cough, sputum, wheezing.  GI: See history of present illness. GU:  Negative for dysuria, hematuria, urinary incontinence, urinary frequency, nocturnal urination.  Endo: Negative for unusual weight change.    Physical Examination:   BP (!) 145/71   Pulse 83   Temp 98.2 F (36.8 C) (Oral)   Ht 6' (1.829 m)   Wt 252 lb (114.3 kg)   BMI 34.18 kg/m   General: Well-nourished, well-developed in no acute distress.  Eyes: No icterus. Conjunctivae pink. Mouth: Oropharyngeal mucosa moist and pink , no lesions erythema or exudate. Lungs: Clear to auscultation bilaterally. Non-labored. Heart: Regular rate and rhythm, no murmurs rubs or gallops.  Abdomen: Bowel sounds are normal, nontender, nondistended, no hepatosplenomegaly or masses, no abdominal  bruits or hernia , no rebound or guarding.   Extremities: No lower extremity edema. No clubbing or deformities. Neuro: Alert and oriented x 3.  Grossly intact. Skin: Warm and dry, no jaundice.   Psych: Alert and cooperative, normal mood and affect.  Labs:    Imaging Studies: No results found.  Assessment and Plan:   Alexandra Mora is a 56 y.o. y/o female who has a history GERD and is not being helped with her PPI twice a day.  The patient also has dyspepsia with bloating and defecation  shortly after eating.  The patient will be set up for a pH study while she is taking her medication to see if she is having acid reflux as the cause of her symptoms.  She will also be started on dicyclomine 10 mg 3 times a day.  The patient has been explained the plan and agrees with it.    Lucilla Lame, MD. [facility]   Note: This dictation was prepared with Dragon dictation along with smaller phrase technology. Any transcriptional errors that result from this process are unintentional.

## 2016-11-04 NOTE — Patient Instructions (Signed)
You are scheduled for a PH impedence at [facility] on 11/26/16. Please contact Endo unit at [phone] for your arrival time.

## 2016-12-30 ENCOUNTER — Other Ambulatory Visit: Payer: Self-pay | Admitting: Physician Assistant

## 2016-12-30 DIAGNOSIS — I10 Essential (primary) hypertension: Secondary | ICD-10-CM

## 2016-12-31 ENCOUNTER — Ambulatory Visit: Admission: RE | Admit: 2016-12-31 | Payer: 59 | Source: Ambulatory Visit | Admitting: Gastroenterology

## 2016-12-31 ENCOUNTER — Encounter: Admission: RE | Payer: Self-pay | Source: Ambulatory Visit

## 2016-12-31 SURGERY — IMPEDANCE PH STUDY, ESOPHAGUS

## 2017-01-06 ENCOUNTER — Encounter: Payer: Self-pay | Admitting: Physician Assistant

## 2017-01-06 ENCOUNTER — Ambulatory Visit (INDEPENDENT_AMBULATORY_CARE_PROVIDER_SITE_OTHER): Payer: 59 | Admitting: Physician Assistant

## 2017-01-06 VITALS — BP 118/70 | HR 90 | Temp 98.8°F | Resp 16 | Wt 254.0 lb

## 2017-01-06 DIAGNOSIS — Z8349 Family history of other endocrine, nutritional and metabolic diseases: Secondary | ICD-10-CM

## 2017-01-06 DIAGNOSIS — M791 Myalgia, unspecified site: Secondary | ICD-10-CM

## 2017-01-06 DIAGNOSIS — Z8489 Family history of other specified conditions: Secondary | ICD-10-CM

## 2017-01-06 DIAGNOSIS — E559 Vitamin D deficiency, unspecified: Secondary | ICD-10-CM

## 2017-01-06 DIAGNOSIS — E538 Deficiency of other specified B group vitamins: Secondary | ICD-10-CM | POA: Diagnosis not present

## 2017-01-06 DIAGNOSIS — R531 Weakness: Secondary | ICD-10-CM

## 2017-01-06 DIAGNOSIS — R748 Abnormal levels of other serum enzymes: Secondary | ICD-10-CM | POA: Diagnosis not present

## 2017-01-06 DIAGNOSIS — M62838 Other muscle spasm: Secondary | ICD-10-CM | POA: Diagnosis not present

## 2017-01-06 DIAGNOSIS — R768 Other specified abnormal immunological findings in serum: Secondary | ICD-10-CM

## 2017-01-06 DIAGNOSIS — R7689 Other specified abnormal immunological findings in serum: Secondary | ICD-10-CM

## 2017-01-06 NOTE — Patient Instructions (Signed)
Muscle Cramps and Spasms  Muscle cramps and spasms occur when a muscle or muscles tighten and you have no control over this tightening (involuntary muscle contraction). They are a common problem and can develop in any muscle. The most common place is in the calf muscles of the leg. Muscle cramps and muscle spasms are both involuntary muscle contractions, but there are some differences between the two:  · Muscle cramps are painful. They come and go and may last a few seconds to 15 minutes. Muscle cramps are often more forceful and last longer than muscle spasms.  · Muscle spasms may or may not be painful. They may also last just a few seconds or much longer.    Certain medical conditions, such as diabetes or Parkinson disease, can make it more likely to develop cramps or spasms. However, cramps or spasms are usually not caused by a serious underlying problem. Common causes include:  · Overexertion.  · Overuse from repetitive motions, or doing the same thing over and over.  · Remaining in a certain position for a long period of time.  · Improper preparation, form, or technique while playing a sport or doing an activity.  · Dehydration.  · Injury.  · Side effects of some medicines.  · Abnormally low levels of the salts and ions in your blood (electrolytes), especially potassium and calcium. This could happen if you are taking water pills (diuretics) or if you are pregnant.    In many cases, the cause of muscle cramps or spasms is unknown.  Follow these instructions at home:  · Stay well hydrated. Drink enough fluid to keep your urine clear or pale yellow.  · Try massaging, stretching, and relaxing the affected muscle.  · If directed, apply heat to tight or tense muscles as often as told by your health care provider. Use the heat source that your health care provider recommends, such as a moist heat pack or a heating pad.  ? Place a towel between your skin and the heat source.  ? Leave the heat on for 20-30  minutes.  ? Remove the heat if your skin turns bright red. This is especially important if you are unable to feel pain, heat, or cold. You may have a greater risk of getting burned.  · If directed, put ice on the affected area. This may help if you are sore or have pain after a cramp or spasm.  ? Put ice in a plastic bag.  ? Place a towel between your skin and the bag.  ? Leave the ice on for 20 minutes, 2-3 times a day.  · Take over-the-counter and prescription medicines only as told by your health care provider.  · Pay attention to any changes in your symptoms.  Contact a health care provider if:  · Your cramps or spasms get more severe or happen more often.  · Your cramps or spasms do not improve over time.  This information is not intended to replace advice given to you by your health care provider. Make sure you discuss any questions you have with your health care provider.  Document Released: 02/22/2002 Document Revised: 10/04/2015 Document Reviewed: 06/06/2015  Elsevier Interactive Patient Education © 2017 [employer]

## 2017-01-06 NOTE — Progress Notes (Signed)
Patient: Alexandra Mora Female    DOB: November 30, 1960   56 y.o.   MRN: [id number] Visit Date: 01/06/2017  Today's Provider: Mar Daring, PA-C   Chief Complaint  Patient presents with  . muscle aches   Subjective:    HPI  Patient comes in today c/o muscle cramps all over for the past 3 weeks. Patient reports that she has been off of the Simvastatin for about 3 weeks, and she has not noticed an improvement in her cramps. Patient reports that initally her cramps were in her hands. Now, she reports they are located in both of her lower legs. 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 She states she has had something similar in the past but took NSAIDs and muscle relaxer and symptoms went away. This time they seem to be worsening. No known family history of RF or lupus. Does have family history of parathyroid tumor in her mother and OA in her mother.     Allergies  Allergen Reactions  . Dexilant [Dexlansoprazole]     Severe diarrhea     Current Outpatient Prescriptions:  .  ascorbic acid (VITAMIN C) 500 MG tablet, Take 500 mg by mouth daily., Disp: , Rfl:  .  Cholecalciferol (VITAMIN D3) 5000 UNITS CAPS, Take 1 capsule by mouth daily., Disp: , Rfl:  .  dicyclomine (BENTYL) 20 MG tablet, Take 1 tablet (20 mg total) by mouth 3 (three) times daily before meals., Disp: 90 tablet, Rfl: 2 .  lisinopril (PRINIVIL,ZESTRIL) 10 MG tablet, TAKE 1 TABLET BY MOUTH DAILY, Disp: 90 tablet, Rfl: 1 .  meloxicam (MOBIC) 15 MG tablet, Take 1 tablet (15 mg total) by mouth as needed., Disp: 90 tablet, Rfl: 1 .  methocarbamol (ROBAXIN) 500 MG tablet, Take 1 tablet by mouth 3 (three) times daily., Disp: , Rfl: 3 .  pantoprazole (PROTONIX) 40 MG tablet, Take 1 tablet (40 mg total) by mouth 2 (two) times daily., Disp: 60 tablet, Rfl: 5 .  paroxetine mesylate (PEXEVA) 10 MG tablet, Take 0.5-1 tablets (5-10 mg total) by mouth every morning., Disp: 30 tablet, Rfl: 11 .   Probiotic Product ([place]) CAPS, Take 1 capsule by mouth daily., Disp: , Rfl:  .  Vitamin D, Ergocalciferol, (DRISDOL) 50000 units CAPS capsule, TAKE 1 CAPSULE BY MOUTH EVERY 7 DAYS, Disp: 12 capsule, Rfl: 1 .  zolpidem (AMBIEN CR) 12.5 MG CR tablet, Take 1 tablet (12.5 mg total) by mouth at bedtime as needed for sleep., Disp: 30 tablet, Rfl: 5 .  simvastatin (ZOCOR) 20 MG tablet, TAKE 1 TABLET BY MOUTH AT BEDTIME (Patient not taking: Reported on 01/06/2017), Disp: 90 tablet, Rfl: 1 .  vitamin B-12 (CYANOCOBALAMIN) 100 MCG tablet, Take 100 mcg by mouth daily., Disp: , Rfl:   Review of Systems  Constitutional: Positive for fatigue. Negative for activity change, appetite change, chills, diaphoresis, fever and unexpected weight change.  HENT: Negative for congestion, ear pain, postnasal drip, rhinorrhea, sinus pain, sinus pressure and trouble swallowing.   Eyes: Negative for visual disturbance.  Respiratory: Negative for cough, chest tightness, shortness of breath and wheezing.   Cardiovascular: Negative for chest pain, palpitations and leg swelling.  Gastrointestinal: Negative for abdominal pain, constipation, diarrhea and nausea.  Musculoskeletal: Positive for arthralgias, joint swelling and myalgias. Negative for back pain, gait problem, neck pain and neck stiffness.  Neurological: Positive for weakness. Negative for dizziness and numbness.  Social History  Substance Use Topics  . Smoking status: Never Smoker  . Smokeless tobacco: Never Used  . Alcohol use No   Objective:   BP 118/70 (BP Location: Right Arm, Patient Position: Sitting, Cuff Size: Large)   Pulse 90   Temp 98.8 F (37.1 C)   Resp 16   Wt 254 lb (115.2 kg)   SpO2 97%   BMI 34.45 kg/m  Vitals:   01/06/17 1600  BP: 118/70  Pulse: 90  Resp: 16  Temp: 98.8 F (37.1 C)  SpO2: 97%  Weight: 254 lb (115.2 kg)     Physical Exam  Constitutional: She appears well-developed and well-nourished. No  distress.  Neck: Normal range of motion. Neck supple. No JVD present. No tracheal deviation present. No thyromegaly present.  Cardiovascular: Normal rate, regular rhythm and normal heart sounds.  Exam reveals no gallop and no friction rub.   No murmur heard. Pulmonary/Chest: Effort normal and breath sounds normal. No respiratory distress. She has no wheezes. She has no rales.  Musculoskeletal: Normal range of motion. She exhibits no edema.  Lymphadenopathy:    She has no cervical adenopathy.  Skin: She is not diaphoretic.  Vitals reviewed.     Assessment & Plan:     1. Muscle spasm Unsure of cause. Will check labs as below. Has had vitamin deficiencies of Vit D and B12. States this does not feel similar. With B12 she did have some neuropathy but that improved with oral supplementation, which she is still taking. She is on high dose Vit D supplementation.   *Addend: RF and CK both came back positive. Will refer to Rheumatology as below.  - CBC w/Diff/Platelet - Comprehensive Metabolic Panel (CMET) - Vitamin D (25 hydroxy) - B12 - CK (Creatine Kinase) - Magnesium - Rheumatoid Factor - ANA w/Reflex if Positive - Ambulatory referral to Rheumatology  2. Myalgia See above medical treatment plan. - CBC w/Diff/Platelet - Comprehensive Metabolic Panel (CMET) - Vitamin D (25 hydroxy) - B12 - CK (Creatine Kinase) - Magnesium - Rheumatoid Factor - ANA w/Reflex if Positive - Ambulatory referral to Rheumatology  3. Weakness See above medical treatment plan. - CBC w/Diff/Platelet - Comprehensive Metabolic Panel (CMET) - Vitamin D (25 hydroxy) - B12 - CK (Creatine Kinase) - Magnesium - Rheumatoid Factor - ANA w/Reflex if Positive  4. Avitaminosis D H/O this and on supplementation. Will check labs and f/u pending labs. - Vitamin D (25 hydroxy)  5. B12 deficiency H/O this and on oral supplementation OTC. Will check labs to make sure she is absorbing and does not have a  pernicious anemia.  - B12  6. Elevated rheumatoid factor RF came back positive with elevated CK. Will refer to rheumatology for further evaluation and treatment options.  - Ambulatory referral to Rheumatology  7. Elevated CK See above medical treatment plan. - Ambulatory referral to Rheumatology       Mar Daring, PA-C  [facility]

## 2017-01-07 LAB — CBC WITH DIFFERENTIAL/PLATELET
Basophils Absolute: 0 10*3/uL (ref 0.0–0.2)
Basos: 0 %
EOS (ABSOLUTE): 0.1 10*3/uL (ref 0.0–0.4)
EOS: 2 %
HEMATOCRIT: 37.1 % (ref 34.0–46.6)
HEMOGLOBIN: 11.8 g/dL (ref 11.1–15.9)
IMMATURE GRANULOCYTES: 0 %
Immature Grans (Abs): 0 10*3/uL (ref 0.0–0.1)
Lymphocytes Absolute: 3.6 10*3/uL — ABNORMAL HIGH (ref 0.7–3.1)
Lymphs: 60 %
MCH: 24.4 pg — ABNORMAL LOW (ref 26.6–33.0)
MCHC: 31.8 g/dL (ref 31.5–35.7)
MCV: 77 fL — ABNORMAL LOW (ref 79–97)
MONOCYTES: 5 %
Monocytes Absolute: 0.3 10*3/uL (ref 0.1–0.9)
NEUTROS PCT: 33 %
Neutrophils Absolute: 2 10*3/uL (ref 1.4–7.0)
Platelets: 283 10*3/uL (ref 150–379)
RBC: 4.83 x10E6/uL (ref 3.77–5.28)
RDW: 15.4 % (ref 12.3–15.4)
WBC: 6 10*3/uL (ref 3.4–10.8)

## 2017-01-07 LAB — COMPREHENSIVE METABOLIC PANEL
ALBUMIN: 4.9 g/dL (ref 3.5–5.5)
ALT: 28 IU/L (ref 0–32)
AST: 22 IU/L (ref 0–40)
Albumin/Globulin Ratio: 1.8 (ref 1.2–2.2)
Alkaline Phosphatase: 55 IU/L (ref 39–117)
BUN/Creatinine Ratio: 14 (ref 9–23)
BUN: 16 mg/dL (ref 6–24)
Bilirubin Total: 0.3 mg/dL (ref 0.0–1.2)
CALCIUM: 10.5 mg/dL — AB (ref 8.7–10.2)
CO2: 27 mmol/L (ref 18–29)
CREATININE: 1.13 mg/dL — AB (ref 0.57–1.00)
Chloride: 103 mmol/L (ref 96–106)
GFR calc Af Amer: 64 mL/min/{1.73_m2} (ref >59)
GFR, EST NON AFRICAN AMERICAN: 55 mL/min/{1.73_m2} — AB (ref >59)
GLOBULIN, TOTAL: 2.8 g/dL (ref 1.5–4.5)
Glucose: 88 mg/dL (ref 65–99)
Potassium: 4.4 mmol/L (ref 3.5–5.2)
SODIUM: 144 mmol/L (ref 134–144)
Total Protein: 7.7 g/dL (ref 6.0–8.5)

## 2017-01-07 LAB — VITAMIN B12: VITAMIN B 12: 1158 pg/mL (ref 232–1245)

## 2017-01-07 LAB — VITAMIN D 25 HYDROXY (VIT D DEFICIENCY, FRACTURES): Vit D, 25-Hydroxy: 65.6 ng/mL (ref 30.0–100.0)

## 2017-01-07 LAB — ANA W/REFLEX IF POSITIVE: ANA: NEGATIVE

## 2017-01-07 LAB — RHEUMATOID FACTOR: RHEUMATOID FACTOR: 30.2 [IU]/mL — AB (ref 0.0–13.9)

## 2017-01-07 LAB — MAGNESIUM: MAGNESIUM: 2.3 mg/dL (ref 1.6–2.3)

## 2017-01-07 LAB — CK: Total CK: 476 U/L — ABNORMAL HIGH (ref 24–173)

## 2017-01-08 ENCOUNTER — Telehealth: Payer: Self-pay

## 2017-01-08 ENCOUNTER — Ambulatory Visit: Payer: 59 | Admitting: Physician Assistant

## 2017-01-08 NOTE — Telephone Encounter (Signed)
-----   Message from [place] sent at 01/07/2017  3:30 PM EDT ----- [facility]  ED

## 2017-01-08 NOTE — Telephone Encounter (Signed)
Patient advised. She agrees to proceed with rheumatology referral.

## 2017-01-08 NOTE — Telephone Encounter (Signed)
Already placed referral yesterday

## 2017-01-10 LAB — PTH, INTACT AND CALCIUM: CALCIUM: 10.2 mg/dL (ref 8.7–10.2)

## 2017-01-10 LAB — SPECIMEN STATUS REPORT

## 2017-01-22 ENCOUNTER — Encounter: Payer: Self-pay | Admitting: Physician Assistant

## 2017-01-22 DIAGNOSIS — R252 Cramp and spasm: Secondary | ICD-10-CM | POA: Insufficient documentation

## 2017-01-22 DIAGNOSIS — R748 Abnormal levels of other serum enzymes: Secondary | ICD-10-CM | POA: Diagnosis not present

## 2017-01-22 DIAGNOSIS — R768 Other specified abnormal immunological findings in serum: Secondary | ICD-10-CM | POA: Insufficient documentation

## 2017-01-22 DIAGNOSIS — E785 Hyperlipidemia, unspecified: Secondary | ICD-10-CM | POA: Diagnosis not present

## 2017-01-22 DIAGNOSIS — E7800 Pure hypercholesterolemia, unspecified: Secondary | ICD-10-CM

## 2017-01-22 MED ORDER — PRAVASTATIN SODIUM 20 MG PO TABS: 20.0000 mg | ORAL_TABLET | Freq: Every day | ORAL | 3 refills | Status: DC

## 2017-01-22 NOTE — Addendum Note (Signed)
Addended by: Mar Daring on: 01/22/2017 03:26 PM   Modules accepted: Orders

## 2017-01-24 ENCOUNTER — Encounter: Payer: Self-pay | Admitting: Physician Assistant

## 2017-01-24 DIAGNOSIS — E7800 Pure hypercholesterolemia, unspecified: Secondary | ICD-10-CM

## 2017-01-27 MED ORDER — EZETIMIBE 10 MG PO TABS: 10.0000 mg | ORAL_TABLET | Freq: Every day | ORAL | 1 refills | Status: DC

## 2017-02-20 ENCOUNTER — Other Ambulatory Visit: Payer: Self-pay | Admitting: Physician Assistant

## 2017-02-20 NOTE — Telephone Encounter (Signed)
LOV 01/06/2017. Vitamin D at that time was 65.6. Renaldo Fiddler, CMA

## 2017-04-30 ENCOUNTER — Encounter: Payer: Self-pay | Admitting: Physician Assistant

## 2017-04-30 ENCOUNTER — Ambulatory Visit (INDEPENDENT_AMBULATORY_CARE_PROVIDER_SITE_OTHER): Payer: 59 | Admitting: Physician Assistant

## 2017-04-30 VITALS — BP 112/62 | HR 97 | Temp 98.5°F | Resp 16 | Ht 72.0 in | Wt 247.6 lb

## 2017-04-30 DIAGNOSIS — E010 Iodine-deficiency related diffuse (endemic) goiter: Secondary | ICD-10-CM

## 2017-04-30 DIAGNOSIS — E559 Vitamin D deficiency, unspecified: Secondary | ICD-10-CM | POA: Diagnosis not present

## 2017-04-30 DIAGNOSIS — Z1211 Encounter for screening for malignant neoplasm of colon: Secondary | ICD-10-CM

## 2017-04-30 DIAGNOSIS — Z1231 Encounter for screening mammogram for malignant neoplasm of breast: Secondary | ICD-10-CM | POA: Diagnosis not present

## 2017-04-30 DIAGNOSIS — E119 Type 2 diabetes mellitus without complications: Secondary | ICD-10-CM | POA: Diagnosis not present

## 2017-04-30 DIAGNOSIS — I10 Essential (primary) hypertension: Secondary | ICD-10-CM

## 2017-04-30 DIAGNOSIS — Z124 Encounter for screening for malignant neoplasm of cervix: Secondary | ICD-10-CM | POA: Diagnosis not present

## 2017-04-30 DIAGNOSIS — Z1272 Encounter for screening for malignant neoplasm of vagina: Secondary | ICD-10-CM | POA: Diagnosis not present

## 2017-04-30 DIAGNOSIS — Z800 Family history of malignant neoplasm of digestive organs: Secondary | ICD-10-CM | POA: Diagnosis not present

## 2017-04-30 DIAGNOSIS — D369 Benign neoplasm, unspecified site: Secondary | ICD-10-CM

## 2017-04-30 DIAGNOSIS — E7800 Pure hypercholesterolemia, unspecified: Secondary | ICD-10-CM

## 2017-04-30 DIAGNOSIS — Z1239 Encounter for other screening for malignant neoplasm of breast: Secondary | ICD-10-CM

## 2017-04-30 DIAGNOSIS — Z0000 Encounter for general adult medical examination without abnormal findings: Secondary | ICD-10-CM

## 2017-04-30 NOTE — Patient Instructions (Signed)
Health Maintenance for Postmenopausal Women Menopause is a normal process in which your reproductive ability comes to an end. This process happens gradually over a span of months to years, usually between the ages of 22 and 9. Menopause is complete when you have missed 12 consecutive menstrual periods. It is important to talk with your health care provider about some of the most common conditions that affect postmenopausal women, such as heart disease, cancer, and bone loss (osteoporosis). Adopting a healthy lifestyle and getting preventive care can help to promote your health and wellness. Those actions can also lower your chances of developing some of these common conditions. What should I know about menopause? During menopause, you may experience a number of symptoms, such as:  Moderate-to-severe hot flashes.  Night sweats.  Decrease in sex drive.  Mood swings.  Headaches.  Tiredness.  Irritability.  Memory problems.  Insomnia.  Choosing to treat or not to treat menopausal changes is an individual decision that you make with your health care provider. What should I know about hormone replacement therapy and supplements? Hormone therapy products are effective for treating symptoms that are associated with menopause, such as hot flashes and night sweats. Hormone replacement carries certain risks, especially as you become older. If you are thinking about using estrogen or estrogen with progestin treatments, discuss the benefits and risks with your health care provider. What should I know about heart disease and stroke? Heart disease, heart attack, and stroke become more likely as you age. This may be due, in part, to the hormonal changes that your body experiences during menopause. These can affect how your body processes dietary fats, triglycerides, and cholesterol. Heart attack and stroke are both medical emergencies. There are many things that you can do to help prevent heart disease  and stroke:  Have your blood pressure checked at least every 1-2 years. High blood pressure causes heart disease and increases the risk of stroke.  If you are 53-22 years old, ask your health care provider if you should take aspirin to prevent a heart attack or a stroke.  Do not use any tobacco products, including cigarettes, chewing tobacco, or electronic cigarettes. If you need help quitting, ask your health care provider.  It is important to eat a healthy diet and maintain a healthy weight. ? Be sure to include plenty of vegetables, fruits, low-fat dairy products, and lean protein. ? Avoid eating foods that are high in solid fats, added sugars, or salt (sodium).  Get regular exercise. This is one of the most important things that you can do for your health. ? Try to exercise for at least 150 minutes each week. The type of exercise that you do should increase your heart rate and make you sweat. This is known as moderate-intensity exercise. ? Try to do strengthening exercises at least twice each week. Do these in addition to the moderate-intensity exercise.  Know your numbers.Ask your health care provider to check your cholesterol and your blood glucose. Continue to have your blood tested as directed by your health care provider.  What should I know about cancer screening? There are several types of cancer. Take the following steps to reduce your risk and to catch any cancer development as early as possible. Breast Cancer  Practice breast self-awareness. ? This means understanding how your breasts normally appear and feel. ? It also means doing regular breast self-exams. Let your health care provider know about any changes, no matter how small.  If you are 40  or older, have a clinician do a breast exam (clinical breast exam or CBE) every year. Depending on your age, family history, and medical history, it may be recommended that you also have a yearly breast X-ray (mammogram).  If you  have a family history of breast cancer, talk with your health care provider about genetic screening.  If you are at high risk for breast cancer, talk with your health care provider about having an MRI and a mammogram every year.  Breast cancer (BRCA) gene test is recommended for women who have family members with BRCA-related cancers. Results of the assessment will determine the need for genetic counseling and BRCA1 and for BRCA2 testing. BRCA-related cancers include these types: ? Breast. This occurs in males or females. ? Ovarian. ? Tubal. This may also be called fallopian tube cancer. ? Cancer of the abdominal or pelvic lining (peritoneal cancer). ? Prostate. ? Pancreatic.  Cervical, Uterine, and Ovarian Cancer Your health care provider may recommend that you be screened regularly for cancer of the pelvic organs. These include your ovaries, uterus, and vagina. This screening involves a pelvic exam, which includes checking for microscopic changes to the surface of your cervix (Pap test).  For women ages 21-65, health care providers may recommend a pelvic exam and a Pap test every three years. For women ages 79-65, they may recommend the Pap test and pelvic exam, combined with testing for human papilloma virus (HPV), every five years. Some types of HPV increase your risk of cervical cancer. Testing for HPV may also be done on women of any age who have unclear Pap test results.  Other health care providers may not recommend any screening for nonpregnant women who are considered low risk for pelvic cancer and have no symptoms. Ask your health care provider if a screening pelvic exam is right for you.  If you have had past treatment for cervical cancer or a condition that could lead to cancer, you need Pap tests and screening for cancer for at least 20 years after your treatment. If Pap tests have been discontinued for you, your risk factors (such as having a new sexual partner) need to be  reassessed to determine if you should start having screenings again. Some women have medical problems that increase the chance of getting cervical cancer. In these cases, your health care provider may recommend that you have screening and Pap tests more often.  If you have a family history of uterine cancer or ovarian cancer, talk with your health care provider about genetic screening.  If you have vaginal bleeding after reaching menopause, tell your health care provider.  There are currently no reliable tests available to screen for ovarian cancer.  Lung Cancer Lung cancer screening is recommended for adults 69-62 years old who are at high risk for lung cancer because of a history of smoking. A yearly low-dose CT scan of the lungs is recommended if you:  Currently smoke.  Have a history of at least 30 pack-years of smoking and you currently smoke or have quit within the past 15 years. A pack-year is smoking an average of one pack of cigarettes per day for one year.  Yearly screening should:  Continue until it has been 15 years since you quit.  Stop if you develop a health problem that would prevent you from having lung cancer treatment.  Colorectal Cancer  This type of cancer can be detected and can often be prevented.  Routine colorectal cancer screening usually begins at  age 42 and continues through age 45.  If you have risk factors for colon cancer, your health care provider may recommend that you be screened at an earlier age.  If you have a family history of colorectal cancer, talk with your health care provider about genetic screening.  Your health care provider may also recommend using home test kits to check for hidden blood in your stool.  A small camera at the end of a tube can be used to examine your colon directly (sigmoidoscopy or colonoscopy). This is done to check for the earliest forms of colorectal cancer.  Direct examination of the colon should be repeated every  5-10 years until age 71. However, if early forms of precancerous polyps or small growths are found or if you have a family history or genetic risk for colorectal cancer, you may need to be screened more often.  Skin Cancer  Check your skin from head to toe regularly.  Monitor any moles. Be sure to tell your health care provider: ? About any new moles or changes in moles, especially if there is a change in a mole's shape or color. ? If you have a mole that is larger than the size of a pencil eraser.  If any of your family members has a history of skin cancer, especially at a young age, talk with your health care provider about genetic screening.  Always use sunscreen. Apply sunscreen liberally and repeatedly throughout the day.  Whenever you are outside, protect yourself by wearing long sleeves, pants, a wide-brimmed hat, and sunglasses.  What should I know about osteoporosis? Osteoporosis is a condition in which bone destruction happens more quickly than new bone creation. After menopause, you may be at an increased risk for osteoporosis. To help prevent osteoporosis or the bone fractures that can happen because of osteoporosis, the following is recommended:  If you are 46-71 years old, get at least 1,000 mg of calcium and at least 600 mg of vitamin D per day.  If you are older than age 55 but younger than age 65, get at least 1,200 mg of calcium and at least 600 mg of vitamin D per day.  If you are older than age 54, get at least 1,200 mg of calcium and at least 800 mg of vitamin D per day.  Smoking and excessive alcohol intake increase the risk of osteoporosis. Eat foods that are rich in calcium and vitamin D, and do weight-bearing exercises several times each week as directed by your health care provider. What should I know about how menopause affects my mental health? Depression may occur at any age, but it is more common as you become older. Common symptoms of depression  include:  Low or sad mood.  Changes in sleep patterns.  Changes in appetite or eating patterns.  Feeling an overall lack of motivation or enjoyment of activities that you previously enjoyed.  Frequent crying spells.  Talk with your health care provider if you think that you are experiencing depression. What should I know about immunizations? It is important that you get and maintain your immunizations. These include:  Tetanus, diphtheria, and pertussis (Tdap) booster vaccine.  Influenza every year before the flu season begins.  Pneumonia vaccine.  Shingles vaccine.  Your health care provider may also recommend other immunizations. This information is not intended to replace advice given to you by your health care provider. Make sure you discuss any questions you have with your health care provider. Document Released: 10/25/2005  Document Revised: 03/22/2016 Document Reviewed: 06/06/2015 Elsevier Interactive Patient Education  2018 Elsevier Inc.  

## 2017-04-30 NOTE — Progress Notes (Signed)
Patient: Alexandra Mora, Female    DOB: 01-31-1961, 56 y.o.   MRN: [id number] Visit Date: 04/30/2017  Today's Provider: Mar Daring, PA-C   Chief Complaint  Patient presents with  . Annual Exam   Subjective:    Annual physical exam Alexandra Mora is a 56 y.o. female who presents today for health maintenance and complete physical. She feels well. She reports exercising. She reports she is sleeping well.  Last CPE:04/24/16 Pap:01/19/14 Negative Mammogram:08/16/16 BI-RADS 1 Colon:02/14/14-Polyps, Dr.Wohl Recheck in 5 years -----------------------------------------------------------------   Review of Systems  Constitutional: Negative.   HENT: Positive for trouble swallowing.   Eyes: Negative.   Respiratory: Negative.   Cardiovascular: Negative.   Gastrointestinal: Negative.   Endocrine: Negative.   Genitourinary: Negative.   Musculoskeletal: Positive for back pain.  Skin: Negative.   Allergic/Immunologic: Negative.   Neurological: Negative.   Hematological: Negative.   Psychiatric/Behavioral: Negative.     Social History      She  reports that she has never smoked. She has never used smokeless tobacco. She reports that she does not drink alcohol or use drugs.       Social History   Social History  . Marital status: Single    Spouse name: N/A  . Number of children: 1  . Years of education: N/A   Social History Main Topics  . Smoking status: Never Smoker  . Smokeless tobacco: Never Used  . Alcohol use No  . Drug use: No  . Sexual activity: No   Other Topics Concern  . None   Social History Narrative  . None    Past Medical History:  Diagnosis Date  . Benign paroxysmal positional vertigo   . Diabetes mellitus without complication ([place])   . Elevated CK   . GERD (gastroesophageal reflux disease)   . Hypercholesteremia   . Hyperglycemia   . Hypertension   . Insomnia   . Menopausal disorder      Patient Active Problem List   Diagnosis  Date Noted  . Low back pain 10/04/2015  . Diabetes type 2, controlled ([place]) 07/05/2015  . Hematuria 03/29/2015  . Acute stress disorder 01/25/2015  . Benign paroxysmal positional nystagmus 01/25/2015  . Big thyroid 01/25/2015  . BP (high blood pressure) 01/25/2015  . Low serum cobalamin 01/25/2015  . Avitaminosis D 01/25/2015  . GERD (gastroesophageal reflux disease) 01/31/2014  . Family history of coronary artery disease 01/31/2014  . Shoulder pain 01/31/2014  . Abnormal EKG 01/31/2014  . Insomnia 03/17/2008  . Family history of colon cancer 09/16/2007  . Hypercholesteremia 01/05/2007    Past Surgical History:  Procedure Laterality Date  . ABDOMINAL HYSTERECTOMY    . LAPAROSCOPIC CHOLECYSTECTOMY    . TOTAL ABDOMINAL HYSTERECTOMY W/ BILATERAL SALPINGOOPHORECTOMY      Family History        Family Status  Relation Status  . Mother Alive       stroke  . Father Alive       diabetes  . Sister Alive  . Brother Alive  . Sister Alive  . Sister Alive  . Other (Not Specified)  . Mat Aunt (Not Specified)  . Mat Aunt (Not Specified)        Her family history includes Breast cancer in her maternal aunt and maternal aunt; Colon cancer in her other; Diabetes in her other; Heart attack (age of onset: 81) in her mother; Heart disease in her mother; Hyperlipidemia in her mother; Hypertension  in her brother, father, mother, sister, sister, and sister; Stroke in her mother.     Allergies  Allergen Reactions  . Dexilant [Dexlansoprazole]     Severe diarrhea  . Statins Other (See Comments)    Increased CK levels     Current Outpatient Prescriptions:  .  ascorbic acid (VITAMIN C) 500 MG tablet, Take 500 mg by mouth daily., Disp: , Rfl:  .  Cholecalciferol (VITAMIN D3) 5000 UNITS CAPS, Take 1 capsule by mouth daily., Disp: , Rfl:  .  dicyclomine (BENTYL) 20 MG tablet, Take 1 tablet (20 mg total) by mouth 3 (three) times daily before meals., Disp: 90 tablet, Rfl: 2 .  ezetimibe  (ZETIA) 10 MG tablet, Take 1 tablet (10 mg total) by mouth daily., Disp: 90 tablet, Rfl: 1 .  lisinopril (PRINIVIL,ZESTRIL) 10 MG tablet, TAKE 1 TABLET BY MOUTH DAILY, Disp: 90 tablet, Rfl: 1 .  meloxicam (MOBIC) 15 MG tablet, Take 1 tablet (15 mg total) by mouth as needed., Disp: 90 tablet, Rfl: 1 .  methocarbamol (ROBAXIN) 500 MG tablet, Take 1 tablet by mouth 3 (three) times daily., Disp: , Rfl: 3 .  pantoprazole (PROTONIX) 40 MG tablet, Take 1 tablet (40 mg total) by mouth 2 (two) times daily., Disp: 60 tablet, Rfl: 5 .  paroxetine mesylate (PEXEVA) 10 MG tablet, Take 0.5-1 tablets (5-10 mg total) by mouth every morning., Disp: 30 tablet, Rfl: 11 .  Probiotic Product ([place]) CAPS, Take 1 capsule by mouth daily., Disp: , Rfl:  .  vitamin B-12 (CYANOCOBALAMIN) 100 MCG tablet, Take 100 mcg by mouth daily., Disp: , Rfl:  .  zolpidem (AMBIEN CR) 12.5 MG CR tablet, Take 1 tablet (12.5 mg total) by mouth at bedtime as needed for sleep., Disp: 30 tablet, Rfl: 5 .  Vitamin D, Ergocalciferol, (DRISDOL) 50000 units CAPS capsule, TAKE 1 CAPSULE BY MOUTH EVERY 7 DAYS (Patient not taking: Reported on 04/30/2017), Disp: 12 capsule, Rfl: 1   Patient Care Team: Mar Daring, PA-C as PCP - General (Family Medicine)      Objective:   Vitals: BP 112/62 (BP Location: Right Arm, Patient Position: Sitting, Cuff Size: Large)   Pulse 97   Temp 98.5 F (36.9 C) (Oral)   Resp 16   Ht 6' (1.829 m)   Wt 247 lb 9.6 oz (112.3 kg)   BMI 33.58 kg/m     Physical Exam  Constitutional: She is oriented to person, place, and time. She appears well-developed and well-nourished.  HENT:  Head: Normocephalic and atraumatic.  Right Ear: External ear normal.  Left Ear: External ear normal.  Nose: Nose normal.  Mouth/Throat: Oropharynx is clear and moist.  Eyes: Pupils are equal, round, and reactive to light. Conjunctivae and EOM are normal.  Neck: Normal range of motion. Neck supple.    Cardiovascular: Normal rate, regular rhythm, normal heart sounds and intact distal pulses.   Pulmonary/Chest: Effort normal and breath sounds normal.  Abdominal: Soft. Bowel sounds are normal.  Musculoskeletal: Normal range of motion.  Neurological: She is alert and oriented to person, place, and time. She has normal reflexes.  Skin: Skin is warm and dry.  Psychiatric: She has a normal mood and affect. Her behavior is normal. Judgment and thought content normal.     Depression Screen PHQ 2/9 Scores 04/30/2017 04/24/2016 03/29/2015  PHQ - 2 Score 0 0 0      Assessment & Plan:     Routine Health Maintenance and Physical Exam  Exercise  Activities and Dietary recommendations Goals    . Exercise 150 minutes per week (moderate activity)       Immunization History  Administered Date(s) Administered  . Influenza,inj,Quad PF,36+ Mos 06/17/2015  . Tdap 01/19/2014    Health Maintenance  Topic Date Due  . PNEUMOCOCCAL POLYSACCHARIDE VACCINE (1) 08/05/1964  . OPHTHALMOLOGY EXAM  08/05/1972  . HIV Screening  08/05/1977  . FOOT EXAM  07/04/2016  . PAP SMEAR  01/19/2017  . HEMOGLOBIN A1C  02/19/2017  . INFLUENZA VACCINE  04/16/2017  . MAMMOGRAM  08/16/2018  . COLONOSCOPY  02/15/2019  . TETANUS/TDAP  01/20/2024  . Hepatitis C Screening  Completed     Discussed health benefits of physical activity, and encouraged her to engage in regular exercise appropriate for her age and condition.   1. Annual physical exam Normal physical exam today. Will check labs as below and f/u pending lab results. If labs are stable and WNL she will not need to have these rechecked for one year at her next annual physical exam. She is to call the office in the meantime if she has any acute issue, questions or concerns. - CBC with Differential/Platelet - Comprehensive metabolic panel - Hemoglobin A1c - Lipid panel - TSH  2. Breast cancer screening Breast exam today was normal. There is no family  history of breast cancer. She does perform regular self breast exams. Mammogram due in December 2018.  3. Vaginal cancer screening Pap collected today. Will send as below and f/u pending results. - Pap IG and HPV (high risk) DNA detection  4. Colon cancer screening Family history of colon cancer. Last colonoscopy in 2015 had 5 polyps with at least one tubular adenoma per patient. Will refer back to GI for repeat colonoscopy. - Ambulatory referral to Gastroenterology  5. Essential hypertension Stable. Continue lisinopril 10mg . Will check labs as below and f/u pending results. - CBC with Differential/Platelet - Comprehensive metabolic panel  6. Big thyroid Previously US and was ok. Family history of parathyroid cancer in her mother. Has had previously elevated calcium. Will check labs as below and f/u pending results. - TSH  7. Controlled type 2 diabetes mellitus without complication, without long-term current use of insulin (HCC) Diet controlled. Will check labs as below and f/u pending results. - Hemoglobin A1c  8. Hypercholesteremia On Zetia. Intolerant to statins (myalgias). Will check labs as below and f/u pending results. - Lipid panel  9. Avitaminosis D Will check labs as below and f/u pending results. - Vitamin D (25 hydroxy)  10. Family history of colon cancer - Ambulatory referral to Gastroenterology  11. Tubular adenoma - Ambulatory referral to Gastroenterology   --------------------------------------------------------------------    Mar Daring, PA-C  [facility]

## 2017-05-02 DIAGNOSIS — I10 Essential (primary) hypertension: Secondary | ICD-10-CM | POA: Diagnosis not present

## 2017-05-02 DIAGNOSIS — E7800 Pure hypercholesterolemia, unspecified: Secondary | ICD-10-CM | POA: Diagnosis not present

## 2017-05-02 DIAGNOSIS — E010 Iodine-deficiency related diffuse (endemic) goiter: Secondary | ICD-10-CM | POA: Diagnosis not present

## 2017-05-02 DIAGNOSIS — E559 Vitamin D deficiency, unspecified: Secondary | ICD-10-CM | POA: Diagnosis not present

## 2017-05-02 DIAGNOSIS — Z0000 Encounter for general adult medical examination without abnormal findings: Secondary | ICD-10-CM | POA: Diagnosis not present

## 2017-05-02 DIAGNOSIS — E119 Type 2 diabetes mellitus without complications: Secondary | ICD-10-CM | POA: Diagnosis not present

## 2017-05-02 LAB — PAP IG AND HPV HIGH-RISK
HPV, high-risk: NEGATIVE
PAP SMEAR COMMENT: 0

## 2017-05-03 LAB — CBC WITH DIFFERENTIAL/PLATELET
BASOS: 1 %
Basophils Absolute: 0 10*3/uL (ref 0.0–0.2)
EOS (ABSOLUTE): 0.1 10*3/uL (ref 0.0–0.4)
EOS: 1 %
HEMATOCRIT: 37.9 % (ref 34.0–46.6)
HEMOGLOBIN: 11.9 g/dL (ref 11.1–15.9)
Immature Grans (Abs): 0 10*3/uL (ref 0.0–0.1)
Immature Granulocytes: 0 %
LYMPHS ABS: 2.4 10*3/uL (ref 0.7–3.1)
Lymphs: 58 %
MCH: 24.6 pg — ABNORMAL LOW (ref 26.6–33.0)
MCHC: 31.4 g/dL — AB (ref 31.5–35.7)
MCV: 78 fL — AB (ref 79–97)
MONOCYTES: 2 %
Monocytes Absolute: 0.1 10*3/uL (ref 0.1–0.9)
NEUTROS ABS: 1.6 10*3/uL (ref 1.4–7.0)
Neutrophils: 38 %
Platelets: 289 10*3/uL (ref 150–379)
RBC: 4.84 x10E6/uL (ref 3.77–5.28)
RDW: 15.4 % (ref 12.3–15.4)
WBC: 4.1 10*3/uL (ref 3.4–10.8)

## 2017-05-03 LAB — COMPREHENSIVE METABOLIC PANEL
A/G RATIO: 1.6 (ref 1.2–2.2)
ALBUMIN: 4.5 g/dL (ref 3.5–5.5)
ALT: 27 IU/L (ref 0–32)
AST: 25 IU/L (ref 0–40)
Alkaline Phosphatase: 48 IU/L (ref 39–117)
BUN/Creatinine Ratio: 13 (ref 9–23)
BUN: 15 mg/dL (ref 6–24)
Bilirubin Total: 0.4 mg/dL (ref 0.0–1.2)
CHLORIDE: 101 mmol/L (ref 96–106)
CO2: 24 mmol/L (ref 20–29)
CREATININE: 1.14 mg/dL — AB (ref 0.57–1.00)
Calcium: 9.8 mg/dL (ref 8.7–10.2)
GFR, EST AFRICAN AMERICAN: 63 mL/min/{1.73_m2} (ref >59)
GFR, EST NON AFRICAN AMERICAN: 55 mL/min/{1.73_m2} — AB (ref >59)
GLOBULIN, TOTAL: 2.9 g/dL (ref 1.5–4.5)
Glucose: 97 mg/dL (ref 65–99)
Potassium: 4.6 mmol/L (ref 3.5–5.2)
Sodium: 140 mmol/L (ref 134–144)
Total Protein: 7.4 g/dL (ref 6.0–8.5)

## 2017-05-03 LAB — HEMOGLOBIN A1C
Est. average glucose Bld gHb Est-mCnc: 131 mg/dL
Hgb A1c MFr Bld: 6.2 % — ABNORMAL HIGH (ref 4.8–5.6)

## 2017-05-03 LAB — VITAMIN D 25 HYDROXY (VIT D DEFICIENCY, FRACTURES): VIT D 25 HYDROXY: 60 ng/mL (ref 30.0–100.0)

## 2017-05-03 LAB — LIPID PANEL
CHOLESTEROL TOTAL: 197 mg/dL (ref 100–199)
Chol/HDL Ratio: 4.3 ratio (ref 0.0–4.4)
HDL: 46 mg/dL (ref >39)
LDL CALC: 126 mg/dL — AB (ref 0–99)
TRIGLYCERIDES: 126 mg/dL (ref 0–149)
VLDL CHOLESTEROL CAL: 25 mg/dL (ref 5–40)

## 2017-05-03 LAB — TSH: TSH: 2.85 u[IU]/mL (ref 0.450–4.500)

## 2017-05-07 ENCOUNTER — Telehealth: Payer: Self-pay

## 2017-05-07 NOTE — Telephone Encounter (Signed)
Gastroenterology Pre-Procedure Review   PATIENT REVIEW QUESTIONS: The patient responded to the following health history questions as indicated:    1. Are you having any GI issues? No everything is the same 2. Do you have a personal history of Polyps? yes 3. Do you have a family history of Colon Cancer or Polyps? Yes, two aunts and one uncle with colon cancer 4. Diabetes Mellitus? No  5. Joint replacements in the past 12 months? No  6. Major health problems in the past 3 months? No  7. Any artificial heart valves, MVP, or defibrillator? No  8. No major medical illnesses     MEDICATIONS & ALLERGIES:    Patient reports the following regarding taking any anticoagulation/antiplatelet therapy:   Plavix, Coumadin, Eliquis, Xarelto, Lovenox, Pradaxa, Brilinta, or Effient? No  Aspirin? No   Patient confirms/reports the following medications:  Current Outpatient Prescriptions  Medication Sig Dispense Refill  . ascorbic acid (VITAMIN C) 500 MG tablet Take 500 mg by mouth daily.    . Cholecalciferol (VITAMIN D3) 5000 UNITS CAPS Take 1 capsule by mouth daily.    Marland Kitchen dicyclomine (BENTYL) 20 MG tablet Take 1 tablet (20 mg total) by mouth 3 (three) times daily before meals. 90 tablet 2  . ezetimibe (ZETIA) 10 MG tablet Take 1 tablet (10 mg total) by mouth daily. 90 tablet 1  . lisinopril (PRINIVIL,ZESTRIL) 10 MG tablet TAKE 1 TABLET BY MOUTH DAILY 90 tablet 1  . meloxicam (MOBIC) 15 MG tablet Take 1 tablet (15 mg total) by mouth as needed. 90 tablet 1  . methocarbamol (ROBAXIN) 500 MG tablet Take 1 tablet by mouth 3 (three) times daily.  3  . pantoprazole (PROTONIX) 40 MG tablet Take 1 tablet (40 mg total) by mouth 2 (two) times daily. 60 tablet 5  . Probiotic Product ([place]) CAPS Take 1 capsule by mouth daily.    . vitamin B-12 (CYANOCOBALAMIN) 100 MCG tablet Take 100 mcg by mouth daily.    . Vitamin D, Ergocalciferol, (DRISDOL) 50000 units CAPS capsule TAKE 1 CAPSULE BY MOUTH EVERY 7  DAYS (Patient not taking: Reported on 04/30/2017) 12 capsule 1  . zolpidem (AMBIEN CR) 12.5 MG CR tablet Take 1 tablet (12.5 mg total) by mouth at bedtime as needed for sleep. 30 tablet 5   No current facility-administered medications for this visit.     Patient confirms/reports the following allergies:  Allergies  Allergen Reactions  . Dexilant [Dexlansoprazole]     Severe diarrhea  . Statins Other (See Comments)    Increased CK levels    No orders of the defined types were placed in this encounter.   AUTHORIZATION INFORMATION Primary Insurance: 1D#: Group #:  Secondary Insurance: 1D#: Group #:  SCHEDULE INFORMATION: Date: 06/13/17, Dr Allen Norris Time: Location: [facility]

## 2017-05-08 ENCOUNTER — Other Ambulatory Visit: Payer: Self-pay

## 2017-05-08 ENCOUNTER — Telehealth: Payer: Self-pay

## 2017-05-08 DIAGNOSIS — Z86010 Personal history of colonic polyps: Secondary | ICD-10-CM

## 2017-05-08 NOTE — Telephone Encounter (Signed)
-----   Message from Mar Daring, PA-C sent at 05/02/2017  8:34 AM EDT ----- Vaginal pap was normal and HPV negative.

## 2017-05-08 NOTE — Telephone Encounter (Signed)
[email or web address]

## 2017-05-09 ENCOUNTER — Other Ambulatory Visit: Payer: Self-pay | Admitting: Physician Assistant

## 2017-05-09 DIAGNOSIS — F5101 Primary insomnia: Secondary | ICD-10-CM

## 2017-05-09 NOTE — Telephone Encounter (Signed)
Patient advised as below.  

## 2017-05-09 NOTE — Telephone Encounter (Signed)
Called into [facility]

## 2017-06-09 ENCOUNTER — Encounter: Payer: Self-pay | Admitting: *Deleted

## 2017-06-10 ENCOUNTER — Other Ambulatory Visit: Payer: Self-pay

## 2017-06-10 DIAGNOSIS — R1310 Dysphagia, unspecified: Secondary | ICD-10-CM

## 2017-06-12 NOTE — Discharge Instructions (Signed)
General Anesthesia, Adult, Care After °These instructions provide you with information about caring for yourself after your procedure. Your health care provider may also give you more specific instructions. Your treatment has been planned according to current medical practices, but problems sometimes occur. Call your health care provider if you have any problems or questions after your procedure. °What can I expect after the procedure? °After the procedure, it is common to have: °· Vomiting. °· A sore throat. °· Mental slowness. ° °It is common to feel: °· Nauseous. °· Cold or shivery. °· Sleepy. °· Tired. °· Sore or achy, even in parts of your body where you did not have surgery. ° °Follow these instructions at home: °For at least 24 hours after the procedure: °· Do not: °? Participate in activities where you could fall or become injured. °? Drive. °? Use heavy machinery. °? Drink alcohol. °? Take sleeping pills or medicines that cause drowsiness. °? Make important decisions or sign legal documents. °? Take care of children on your own. °· Rest. °Eating and drinking °· If you vomit, drink water, juice, or soup when you can drink without vomiting. °· Drink enough fluid to keep your urine clear or pale yellow. °· Make sure you have little or no nausea before eating solid foods. °· Follow the diet recommended by your health care provider. °General instructions °· Have a responsible adult stay with you until you are awake and alert. °· Return to your normal activities as told by your health care provider. Ask your health care provider what activities are safe for you. °· Take over-the-counter and prescription medicines only as told by your health care provider. °· If you smoke, do not smoke without supervision. °· Keep all follow-up visits as told by your health care provider. This is important. °Contact a health care provider if: °· You continue to have nausea or vomiting at home, and medicines are not helpful. °· You  cannot drink fluids or start eating again. °· You cannot urinate after 8-12 hours. °· You develop a skin rash. °· You have fever. °· You have increasing redness at the site of your procedure. °Get help right away if: °· You have difficulty breathing. °· You have chest pain. °· You have unexpected bleeding. °· You feel that you are having a life-threatening or urgent problem. °This information is not intended to replace advice given to you by your health care provider. Make sure you discuss any questions you have with your health care provider. °Document Released: 12/09/2000 Document Revised: 02/05/2016 Document Reviewed: 08/17/2015 °Elsevier Interactive Patient Education © 2018 [employer] ° °

## 2017-06-13 ENCOUNTER — Ambulatory Visit: Payer: 59 | Admitting: Anesthesiology

## 2017-06-13 ENCOUNTER — Encounter
Admission: RE | Disposition: A | Discharge status: Home / Self Care | Payer: Self-pay | Source: Ambulatory Visit | Attending: Gastroenterology

## 2017-06-13 ENCOUNTER — Ambulatory Visit
Admission: RE | Admit: 2017-06-13 | Discharge: 2017-06-13 | Disposition: A | Discharge status: Home / Self Care | Payer: 59 | Source: Ambulatory Visit | Attending: Gastroenterology | Admitting: Gastroenterology

## 2017-06-13 DIAGNOSIS — Z79899 Other long term (current) drug therapy: Secondary | ICD-10-CM | POA: Insufficient documentation

## 2017-06-13 DIAGNOSIS — D125 Benign neoplasm of sigmoid colon: Secondary | ICD-10-CM

## 2017-06-13 DIAGNOSIS — E7800 Pure hypercholesterolemia, unspecified: Secondary | ICD-10-CM | POA: Insufficient documentation

## 2017-06-13 DIAGNOSIS — I10 Essential (primary) hypertension: Secondary | ICD-10-CM | POA: Diagnosis not present

## 2017-06-13 DIAGNOSIS — E119 Type 2 diabetes mellitus without complications: Secondary | ICD-10-CM | POA: Insufficient documentation

## 2017-06-13 DIAGNOSIS — Z1211 Encounter for screening for malignant neoplasm of colon: Secondary | ICD-10-CM | POA: Insufficient documentation

## 2017-06-13 DIAGNOSIS — Z86010 Personal history of colonic polyps: Secondary | ICD-10-CM | POA: Diagnosis not present

## 2017-06-13 DIAGNOSIS — K635 Polyp of colon: Secondary | ICD-10-CM | POA: Insufficient documentation

## 2017-06-13 DIAGNOSIS — D124 Benign neoplasm of descending colon: Secondary | ICD-10-CM | POA: Insufficient documentation

## 2017-06-13 HISTORY — PX: COLONOSCOPY WITH PROPOFOL: SHX5780

## 2017-06-13 HISTORY — PX: POLYPECTOMY: SHX5525

## 2017-06-13 HISTORY — DX: Unspecified osteoarthritis, unspecified site: M19.90

## 2017-06-13 SURGERY — COLONOSCOPY WITH PROPOFOL
Anesthesia: General | Wound class: Dirty or Infected

## 2017-06-13 MED ORDER — SODIUM CHLORIDE 0.9 % IV SOLN: INTRAVENOUS | Status: DC

## 2017-06-13 MED ORDER — PROPOFOL 10 MG/ML IV BOLUS
INTRAVENOUS | Status: DC | PRN
  Administered 2017-06-13: 50 mg via INTRAVENOUS
  Administered 2017-06-13: 25 mg via INTRAVENOUS
  Administered 2017-06-13 (×4): 50 mg via INTRAVENOUS

## 2017-06-13 MED ORDER — LIDOCAINE HCL (CARDIAC) 20 MG/ML IV SOLN
INTRAVENOUS | Status: DC | PRN
  Administered 2017-06-13: 50 mg via INTRAVENOUS

## 2017-06-13 MED ORDER — LACTATED RINGERS IV SOLN
INTRAVENOUS | Status: DC
  Administered 2017-06-13: 11:00:00 via INTRAVENOUS

## 2017-06-13 MED ORDER — STERILE WATER FOR IRRIGATION IR SOLN
Status: DC | PRN
  Administered 2017-06-13: 13:00:00

## 2017-06-13 SURGICAL SUPPLY — 23 items
CANISTER SUCT 1200ML W/VALVE (MISCELLANEOUS) ×3 IMPLANT
CLIP HMST 235XBRD CATH ROT (MISCELLANEOUS) IMPLANT
CLIP RESOLUTION 360 11X235 (MISCELLANEOUS)
FCP ESCP3.2XJMB 240X2.8X (MISCELLANEOUS)
FORCEPS BIOP RAD 4 LRG CAP 4 (CUTTING FORCEPS) ×6 IMPLANT
FORCEPS BIOP RJ4 240 W/NDL (MISCELLANEOUS)
FORCEPS ESCP3.2XJMB 240X2.8X (MISCELLANEOUS) IMPLANT
GOWN CVR UNV OPN BCK APRN NK (MISCELLANEOUS) ×4 IMPLANT
GOWN ISOL THUMB LOOP REG UNIV (MISCELLANEOUS) ×2
INJECTOR VARIJECT VIN23 (MISCELLANEOUS) IMPLANT
KIT DEFENDO VALVE AND CONN (KITS) IMPLANT
KIT ENDO PROCEDURE OLY (KITS) ×3 IMPLANT
MARKER SPOT ENDO TATTOO 5ML (MISCELLANEOUS) IMPLANT
PAD GROUND ADULT SPLIT (MISCELLANEOUS) IMPLANT
PROBE APC STR FIRE (PROBE) IMPLANT
RETRIEVER NET ROTH 2.5X230 LF (MISCELLANEOUS) IMPLANT
SNARE SHORT THROW 13M SML OVAL (MISCELLANEOUS) ×3 IMPLANT
SNARE SHORT THROW 30M LRG OVAL (MISCELLANEOUS) IMPLANT
SNARE SNG USE RND 15MM (INSTRUMENTS) IMPLANT
SPOT EX ENDOSCOPIC TATTOO (MISCELLANEOUS)
TRAP ETRAP POLY (MISCELLANEOUS) IMPLANT
VARIJECT INJECTOR VIN23 (MISCELLANEOUS)
WATER STERILE IRR 250ML POUR (IV SOLUTION) ×3 IMPLANT

## 2017-06-13 NOTE — Anesthesia Procedure Notes (Signed)
Performed by: Dinh Ayotte Pre-anesthesia Checklist: Patient identified, Emergency Drugs available, Suction available, Timeout performed and Patient being monitored Patient Re-evaluated:Patient Re-evaluated prior to induction Oxygen Delivery Method: Nasal cannula Placement Confirmation: positive ETCO2       

## 2017-06-13 NOTE — H&P (Signed)
Lucilla Lame, MD [address], [place] [address] Phone:[phone] Fax : [phone]  Primary Care Physician:  Mar Daring, PA-C Primary Gastroenterologist:  Dr. Allen Norris  Pre-Procedure History & Physical: HPI:  Alexandra Mora is a 56 y.o. female is here for an colonoscopy.   Past Medical History:  Diagnosis Date  . Arthritis    lower back  . Benign paroxysmal positional vertigo   . Diabetes mellitus without complication ([place])   . Elevated CK   . GERD (gastroesophageal reflux disease)   . Hypercholesteremia   . Hyperglycemia   . Hypertension   . Insomnia   . Menopausal disorder     Past Surgical History:  Procedure Laterality Date  . ABDOMINAL HYSTERECTOMY    . LAPAROSCOPIC CHOLECYSTECTOMY    . TOTAL ABDOMINAL HYSTERECTOMY W/ BILATERAL SALPINGOOPHORECTOMY      Prior to Admission medications   Medication Sig Start Date End Date Taking? Authorizing Provider  ascorbic acid (VITAMIN C) 500 MG tablet Take 500 mg by mouth daily.   Yes [provider]  Cholecalciferol (VITAMIN D3) 5000 UNITS CAPS Take 1 capsule by mouth daily.   Yes [provider]  Cyanocobalamin (B-12) 100 MCG TABS Take by mouth.   Yes [provider]  ezetimibe (ZETIA) 10 MG tablet Take 1 tablet (10 mg total) by mouth daily. 01/27/17  Yes Mar Daring, PA-C  lisinopril (PRINIVIL,ZESTRIL) 10 MG tablet TAKE 1 TABLET BY MOUTH DAILY 12/30/16  Yes Mar Daring, PA-C  meloxicam (MOBIC) 15 MG tablet Take 1 tablet (15 mg total) by mouth as needed. 10/04/15  Yes Margarita Rana, MD  methocarbamol (ROBAXIN) 500 MG tablet Take 1 tablet by mouth 3 (three) times daily. 10/04/16  Yes [provider]  pantoprazole (PROTONIX) 40 MG tablet Take 1 tablet (40 mg total) by mouth 2 (two) times daily. 10/09/16  Yes Mar Daring, PA-C  Probiotic Product ([place]) CAPS Take by mouth.   Yes [provider]  Vitamin D,  Ergocalciferol, (DRISDOL) 50000 units CAPS capsule TAKE 1 CAPSULE BY MOUTH EVERY 7 DAYS 02/20/17  Yes Fenton Malling M, PA-C  zolpidem (AMBIEN CR) 12.5 MG CR tablet TAKE ONE TABLET BY MOUTH AT BEDTIME AS NEEDED FOR SLEEP 05/09/17  Yes Fenton Malling M, PA-C  dicyclomine (BENTYL) 20 MG tablet Take 1 tablet (20 mg total) by mouth 3 (three) times daily before meals. Patient not taking: Reported on 06/09/2017 11/04/16   Lucilla Lame, MD  PARoxetine (PAXIL) 10 MG tablet  03/14/17   [provider]    Allergies as of 05/08/2017 - Review Complete 04/30/2017  Allergen Reaction Noted  . Dexilant [dexlansoprazole]  01/31/2014  . Statins Other (See Comments) 01/27/2017    Family History  Problem Relation Age of Onset  . Heart disease Mother   . Heart attack Mother 3  . Hyperlipidemia Mother   . Hypertension Mother   . Stroke Mother   . Hypertension Father   . Hypertension Sister   . Hypertension Brother   . Hypertension Sister   . Hypertension Sister   . Colon cancer Other   . Diabetes Other   . Breast cancer Maternal Aunt   . Breast cancer Maternal Aunt     Social History   Social History  . Marital status: Single    Spouse name: N/A  . Number of children: 1  . Years of education: N/A   Occupational History  . Not on file.   Social  History Main Topics  . Smoking status: Never Smoker  . Smokeless tobacco: Never Used  . Alcohol use No  . Drug use: No  . Sexual activity: No   Other Topics Concern  . Not on file   Social History Narrative  . No narrative on file    Review of Systems: See HPI, otherwise negative ROS  Physical Exam: BP 123/78   Pulse 98   Temp 98.1 F (36.7 C)   Resp 16   Ht 6' (1.829 m)   Wt 241 lb (109.3 kg)   SpO2 99%   BMI 32.69 kg/m  General:   Alert,  pleasant and cooperative in NAD Head:  Normocephalic and atraumatic. Neck:  Supple; no masses or thyromegaly. Lungs:  Clear throughout to auscultation.    Heart:  Regular rate  and rhythm. Abdomen:  Soft, nontender and nondistended. Normal bowel sounds, without guarding, and without rebound.   Neurologic:  Alert and  oriented x4;  grossly normal neurologically.  Impression/Plan: Alexandra Mora is here for an colonoscopy to be performed for history of colon polyps  Risks, benefits, limitations, and alternatives regarding  colonoscopy have been reviewed with the patient.  Questions have been answered.  All parties agreeable.   Lucilla Lame, MD  06/13/2017, 11:49 AM

## 2017-06-13 NOTE — Transfer of Care (Signed)
Immediate Anesthesia Transfer of Care Note  Patient: Alexandra Mora  Procedure(s) Performed: Procedure(s): COLONOSCOPY WITH PROPOFOL (N/A) POLYPECTOMY  Patient Location: PACU  Anesthesia Type: General  Level of Consciousness: awake, alert  and patient cooperative  Airway and Oxygen Therapy: Patient Spontanous Breathing and Patient connected to supplemental oxygen  Post-op Assessment: Post-op Vital signs reviewed, Patient's Cardiovascular Status Stable, Respiratory Function Stable, Patent Airway and No signs of Nausea or vomiting  Post-op Vital Signs: Reviewed and stable  Complications: No apparent anesthesia complications

## 2017-06-13 NOTE — Op Note (Signed)
[facility] Patient Name: Alexandra Mora Procedure Date: 06/13/2017 12:48 PM MRN: [id number] Account #: [id number] Date of Birth: [birth year] Admit Type: Outpatient Age: 56 Room: [facility] OR ROOM 01 Gender: Female Note Status: Finalized Procedure:            Colonoscopy Indications:          High risk colon cancer surveillance: Personal history                        of colonic polyps Providers:            Lucilla Lame MD, MD Referring MD:         Mar Daring (Referring MD) Medicines:            Propofol per Anesthesia Complications:        No immediate complications. Procedure:            Pre-Anesthesia Assessment:                       - Prior to the procedure, a History and Physical was                        performed, and patient medications and allergies were                        reviewed. The patient's tolerance of previous                        anesthesia was also reviewed. The risks and benefits of                        the procedure and the sedation options and risks were                        discussed with the patient. All questions were                        answered, and informed consent was obtained. Prior                        Anticoagulants: The patient has taken no previous                        anticoagulant or antiplatelet agents. ASA Grade                        Assessment: II - A patient with mild systemic disease.                        After reviewing the risks and benefits, the patient was                        deemed in satisfactory condition to undergo the                        procedure.                       After obtaining informed consent, the colonoscope was  passed under direct vision. Throughout the procedure,                        the patient's blood pressure, pulse, and oxygen                        saturations were monitored continuously. The [place] [id number]) was introduced through the                        anus and advanced to the the cecum, identified by                        appendiceal orifice and ileocecal valve. The                        colonoscopy was performed without difficulty. The                        patient tolerated the procedure well. The quality of                        the bowel preparation was excellent. Findings:      The perianal and digital rectal examinations were normal.      Five sessile polyps were found in the sigmoid colon. The polyps were 2       to 3 mm in size. These polyps were removed with a cold biopsy forceps.       Resection and retrieval were complete.      A 3 mm polyp was found in the descending colon. The polyp was sessile.       The polyp was removed with a cold biopsy forceps. Resection and       retrieval were complete.      Non-bleeding internal hemorrhoids were found during retroflexion. The       hemorrhoids were Grade II (internal hemorrhoids that prolapse but reduce       spontaneously). Impression:           - Five 2 to 3 mm polyps in the sigmoid colon, removed                        with a cold biopsy forceps. Resected and retrieved.                       - One 3 mm polyp in the descending colon, removed with                        a cold biopsy forceps. Resected and retrieved.                       - Non-bleeding internal hemorrhoids. Recommendation:       - Discharge patient to home.                       - Resume previous diet.                       - Continue present medications.                       -  Await pathology results.                       - Repeat colonoscopy in 5 years for surveillance. Procedure Code(s):    --- Professional ---                       [phone], Colonoscopy, flexible; with biopsy, single or                        multiple Diagnosis Code(s):    --- Professional ---                       Z86.010, Personal history of colonic polyps                        D12.5, Benign neoplasm of sigmoid colon                       D12.4, Benign neoplasm of descending colon CPT copyright 2016 American Medical [employer]. All rights reserved. The codes documented in this report are preliminary and upon coder review may  be revised to meet current compliance requirements. Lucilla Lame MD, MD 06/13/2017 1:07:59 PM This report has been signed electronically. Number of Addenda: 0 Note Initiated On: 06/13/2017 12:48 PM Scope Withdrawal Time: 0 hours 7 minutes 56 seconds  Total Procedure Duration: 0 hours 15 minutes 11 seconds       [facility]

## 2017-06-13 NOTE — Anesthesia Postprocedure Evaluation (Signed)
Anesthesia Post Note  Patient: Alexandra Mora  Procedure(s) Performed: Procedure(s) (LRB): COLONOSCOPY WITH PROPOFOL (N/A) POLYPECTOMY  Patient location during evaluation: PACU Anesthesia Type: General Level of consciousness: awake and alert Pain management: pain level controlled Vital Signs Assessment: post-procedure vital signs reviewed and stable Respiratory status: spontaneous breathing Cardiovascular status: blood pressure returned to baseline Postop Assessment: no headache Anesthetic complications: no    Jaci Standard, III,  Nailea Whitehorn D

## 2017-06-13 NOTE — Anesthesia Preprocedure Evaluation (Signed)
Anesthesia Evaluation  Patient identified by MRN, date of birth, ID band Patient awake    Reviewed: Allergy & Precautions, H&P , NPO status , Patient's Chart, lab work & pertinent test results  Airway Mallampati: II  TM Distance: >3 FB Neck ROM: full    Dental no notable dental hx.    Pulmonary neg pulmonary ROS,    Pulmonary exam normal        Cardiovascular hypertension, On Medications Normal cardiovascular exam     Neuro/Psych negative neurological ROS     GI/Hepatic Neg liver ROS, Medicated,  Endo/Other  diabetes, Well Controlled, Type 2  Renal/GU   negative genitourinary   Musculoskeletal   Abdominal   Peds  Hematology negative hematology ROS (+)   Anesthesia Other Findings   Reproductive/Obstetrics                             Anesthesia Physical Anesthesia Plan  ASA: II  Anesthesia Plan: General   Post-op Pain Management:    Induction:   PONV Risk Score and Plan:   Airway Management Planned:   Additional Equipment:   Intra-op Plan:   Post-operative Plan:   Informed Consent: I have reviewed the patients History and Physical, chart, labs and discussed the procedure including the risks, benefits and alternatives for the proposed anesthesia with the patient or authorized representative who has indicated his/her understanding and acceptance.     Plan Discussed with:   Anesthesia Plan Comments:         Anesthesia Quick Evaluation

## 2017-06-17 ENCOUNTER — Encounter: Payer: Self-pay | Admitting: Gastroenterology

## 2017-06-23 ENCOUNTER — Other Ambulatory Visit: Payer: Self-pay

## 2017-06-23 ENCOUNTER — Ambulatory Visit
Admission: RE | Admit: 2017-06-23 | Discharge: 2017-06-23 | Disposition: A | Discharge status: Home / Self Care | Payer: 59 | Source: Ambulatory Visit | Attending: Gastroenterology | Admitting: Gastroenterology

## 2017-06-23 DIAGNOSIS — R1310 Dysphagia, unspecified: Secondary | ICD-10-CM | POA: Insufficient documentation

## 2017-06-23 DIAGNOSIS — K449 Diaphragmatic hernia without obstruction or gangrene: Secondary | ICD-10-CM | POA: Diagnosis not present

## 2017-06-24 ENCOUNTER — Other Ambulatory Visit: Payer: Self-pay

## 2017-06-24 MED ORDER — ESOMEPRAZOLE MAGNESIUM 40 MG PO CPDR: 40.0000 mg | DELAYED_RELEASE_CAPSULE | Freq: Every day | ORAL | 6 refills | Status: DC

## 2017-06-30 ENCOUNTER — Other Ambulatory Visit: Payer: Self-pay | Admitting: Physician Assistant

## 2017-06-30 DIAGNOSIS — I10 Essential (primary) hypertension: Secondary | ICD-10-CM

## 2017-07-02 ENCOUNTER — Other Ambulatory Visit: Payer: Self-pay | Admitting: Physician Assistant

## 2017-07-02 DIAGNOSIS — Z1231 Encounter for screening mammogram for malignant neoplasm of breast: Secondary | ICD-10-CM

## 2017-07-09 ENCOUNTER — Other Ambulatory Visit: Payer: Self-pay

## 2017-07-23 DIAGNOSIS — R768 Other specified abnormal immunological findings in serum: Secondary | ICD-10-CM | POA: Diagnosis not present

## 2017-07-23 DIAGNOSIS — R252 Cramp and spasm: Secondary | ICD-10-CM | POA: Diagnosis not present

## 2017-08-05 DIAGNOSIS — H524 Presbyopia: Secondary | ICD-10-CM | POA: Diagnosis not present

## 2017-08-06 ENCOUNTER — Other Ambulatory Visit: Payer: Self-pay | Admitting: Physician Assistant

## 2017-08-06 DIAGNOSIS — E7800 Pure hypercholesterolemia, unspecified: Secondary | ICD-10-CM

## 2017-08-20 ENCOUNTER — Ambulatory Visit
Admission: RE | Admit: 2017-08-20 | Discharge: 2017-08-20 | Disposition: A | Discharge status: Home / Self Care | Payer: 59 | Source: Ambulatory Visit | Attending: Physician Assistant | Admitting: Physician Assistant

## 2017-08-20 DIAGNOSIS — Z1231 Encounter for screening mammogram for malignant neoplasm of breast: Secondary | ICD-10-CM | POA: Insufficient documentation

## 2017-08-21 ENCOUNTER — Telehealth: Payer: Self-pay

## 2017-08-21 NOTE — Telephone Encounter (Signed)
LM regarding mammogram results and that she can view the results though my chart.  Thanks,  -Joseline

## 2017-08-21 NOTE — Telephone Encounter (Signed)
-----   Message from Mar Daring, [place] sent at 08/21/2017  8:42 AM EST ----- Normal mammogram. Repeat screening in one year.

## 2017-11-02 IMAGING — RF DG ESOPHAGUS
8 series · 11 of 11 positions shown · non-contrast
Comparison: None in PACs

CLINICAL DATA: Increasing difficulty swallowing intermittently for
the past year. Symptoms are greatest in the morning and symptoms
last for 5 hours. Patient reports pills becoming stuck.

EXAM:
ESOPHOGRAM / BARIUM SWALLOW / BARIUM TABLET STUDY
TECHNIQUE: Combined double contrast and single contrast examination performed
using effervescent crystals, thick barium liquid, and thin barium
liquid. The patient was observed with fluoroscopy swallowing a 13 mm
barium sulphate tablet.
FLUOROSCOPY TIME:  Fluoroscopy Time:  In its 0 minutes, 54 seconds
Radiation Exposure Index (if provided by the fluoroscopic device):
New 906 micro Gy per meters square
Number of Acquired Spot Images: 7 +1 video loop.

[Series 1: fluoro_barium 2fps_bw · 0.17mm/px · 1 of 1 slices shown (1 of 8)]
[im 1/1]
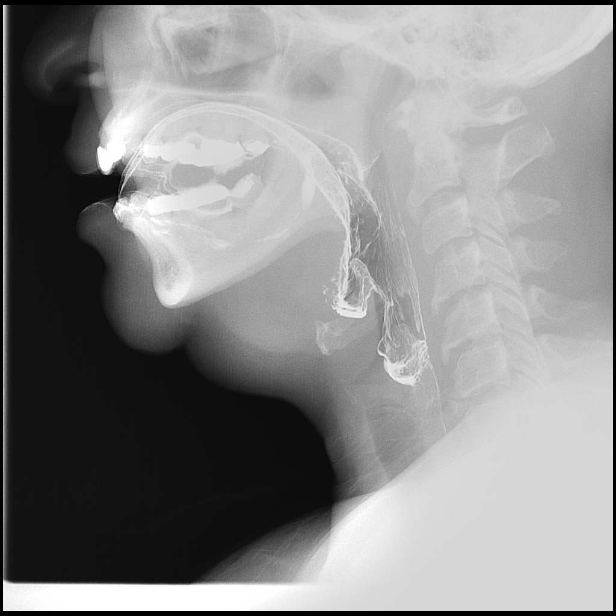

[Series 2: fluoro_barium 2fps_bw · 0.17mm/px · 1 of 1 slices shown (2 of 8)]
[im 1/1]
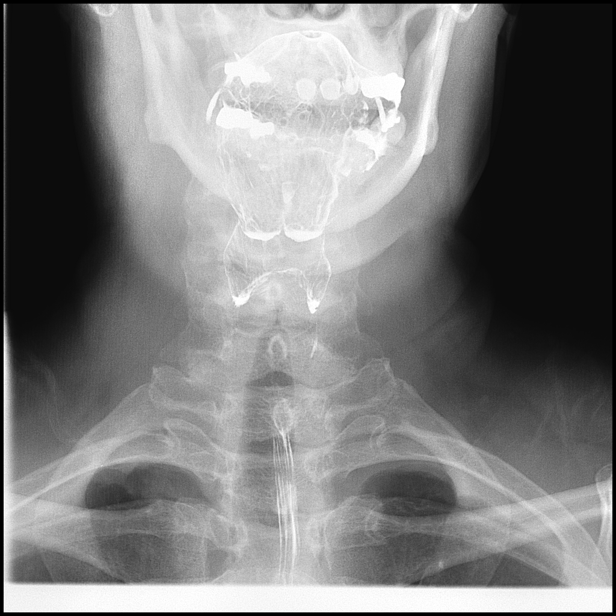

[Series 3: fluoro_barium 2fps_bw · 0.17mm/px · 1 of 1 slices shown (3 of 8)]
[im 1/1]
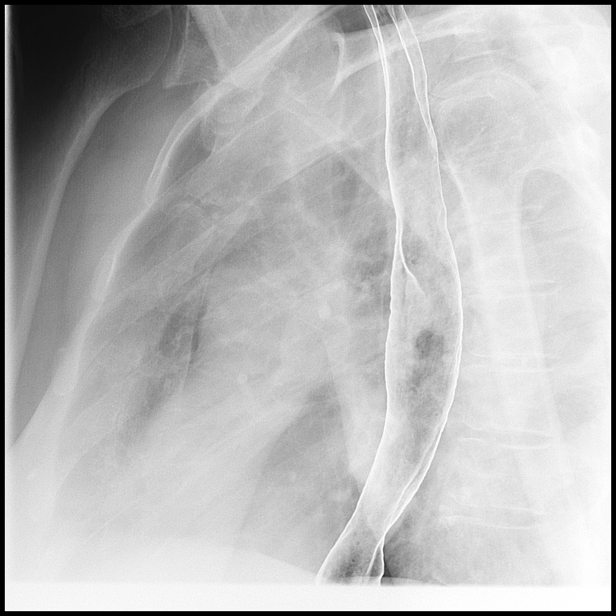

[Series 4: fluoro_barium 2fps_bw · 0.17mm/px · 1 of 1 slices shown (4 of 8)]
[im 1/1]
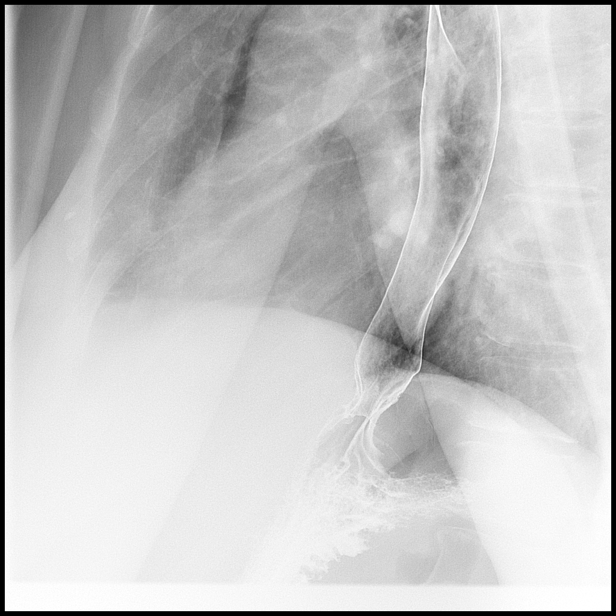

[Series 5: fluoro_barium 2fps_bw · 0.17mm/px · 1 of 1 slices shown (5 of 8)]
[im 1/1]
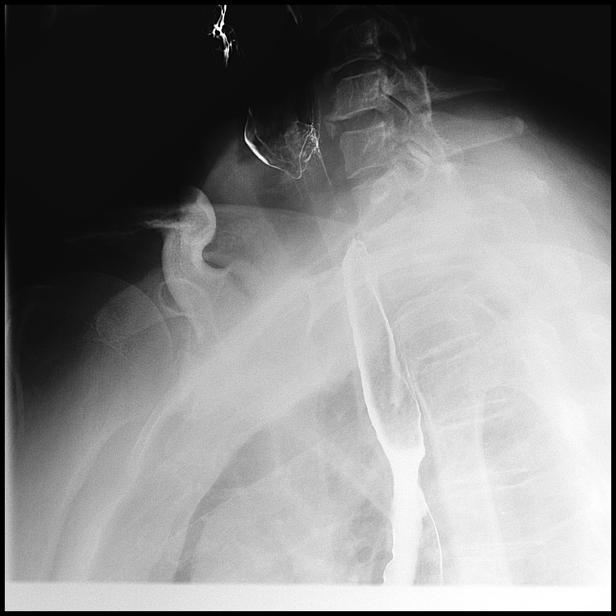

[Series 6: fluoro_barium 2fps_bw · 0.17mm/px · 1 of 1 slices shown (6 of 8)]
[im 1/1]
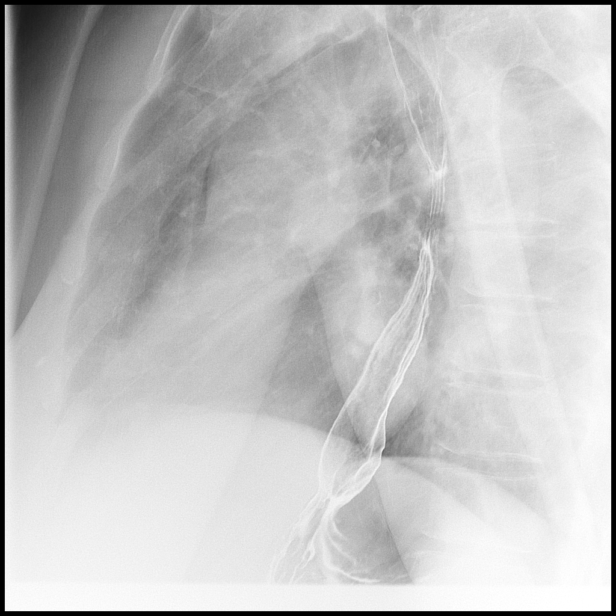

[Series 7: fluoro_barium 2fps_bw · 0.18mm/px · 1 of 1 slices shown (7 of 8)]
[im 1/1]
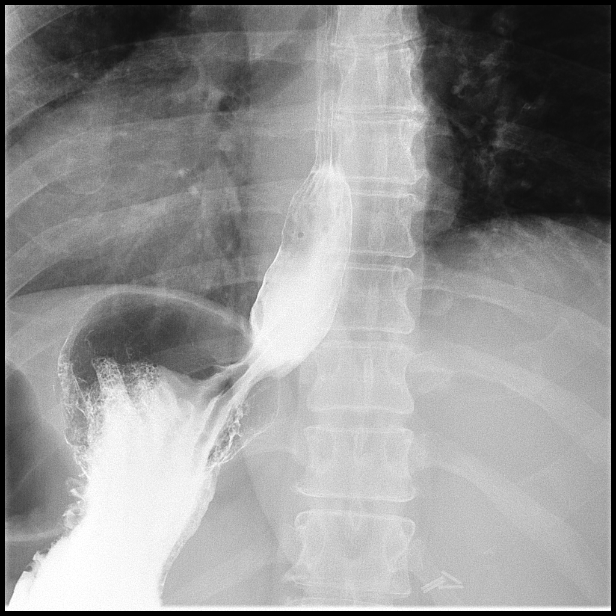

[Series 8: fluoro_barium 2fps_bw · 0.18mm/px · 4 of 14 frames shown (8 of 8)]
[frame 3/14]
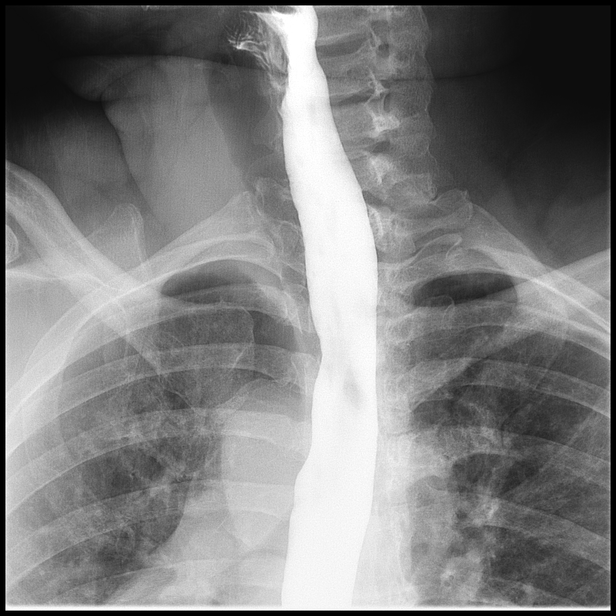
[frame 7/14]
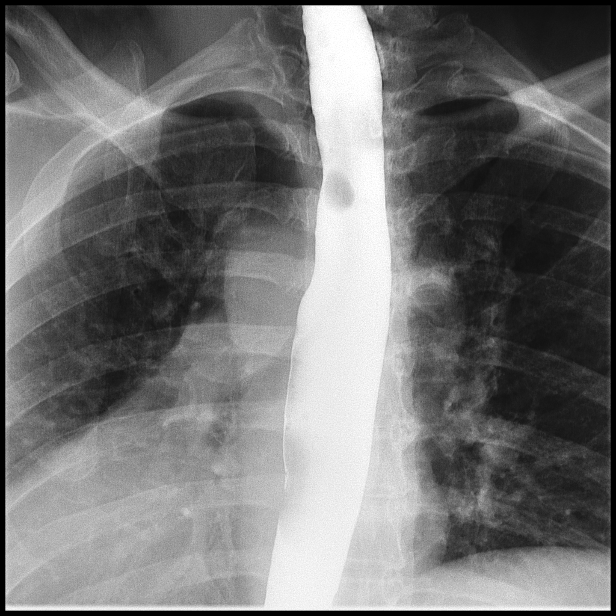
[frame 8/14]
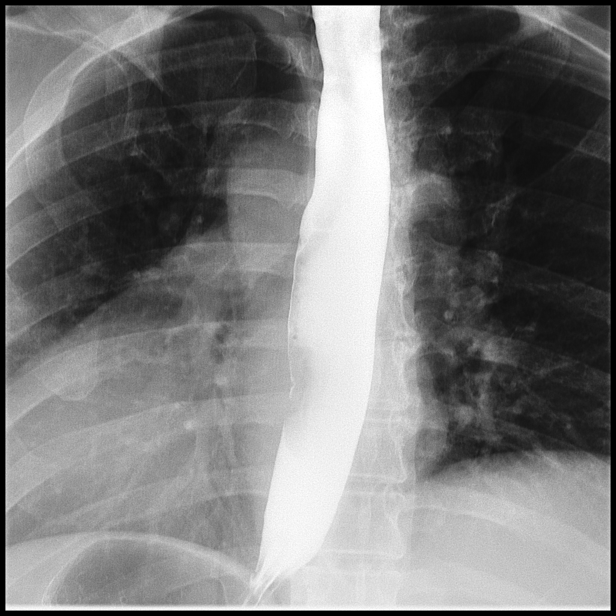
[frame 12/14]
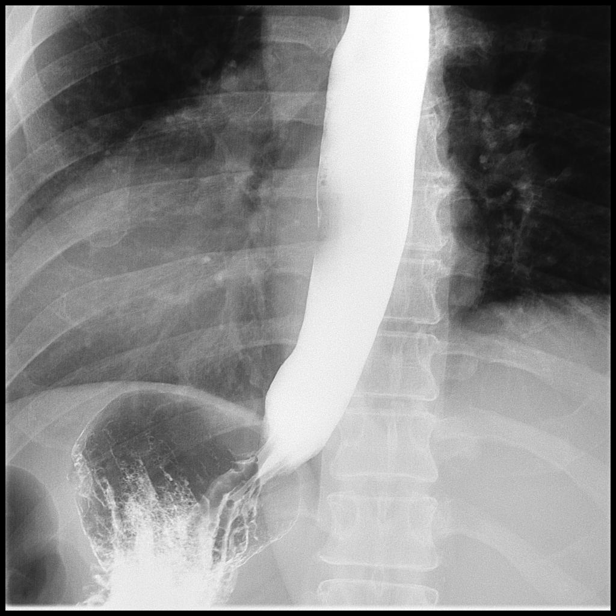

[11 of 11 positions shown; findings below may reference images not displayed]

FINDINGS: The patient ingested thick and thin barium and the gas-forming
crystals without difficulty. The hypopharynx distended well. There
was no laryngeal penetration of the barium. Anterior endplate
osteophytes in the mid and lower cervical spine produce no
significant impact upon the cervical esophagus. The cervical and
thoracic esophagus distended well. The mucosal pattern was normal.
There was a tiny reducible hiatal hernia. There was no evidence of
stricture nor esophagitis. The barium tablet passed promptly from
the mouth to the stomach. No reflux was observed.
IMPRESSION: Tiny reducible hiatal hernia. No evidence of stricture, reflux, or
esophagitis.

If the patient's symptoms persist and chronic sinus drainage has
been excluded, upper endoscopy would be a useful next imaging step.

## 2017-12-19 ENCOUNTER — Other Ambulatory Visit: Payer: Self-pay | Admitting: Physician Assistant

## 2017-12-19 DIAGNOSIS — F5101 Primary insomnia: Secondary | ICD-10-CM

## 2017-12-19 NOTE — Telephone Encounter (Signed)
Called in Rx as below.  

## 2018-02-03 ENCOUNTER — Other Ambulatory Visit: Payer: Self-pay | Admitting: Physician Assistant

## 2018-02-03 DIAGNOSIS — E7800 Pure hypercholesterolemia, unspecified: Secondary | ICD-10-CM

## 2018-02-24 ENCOUNTER — Other Ambulatory Visit: Payer: Self-pay | Admitting: Gastroenterology

## 2018-02-24 MED ORDER — DICYCLOMINE HCL 20 MG PO TABS: 20.0000 mg | ORAL_TABLET | Freq: Three times a day (TID) | ORAL | 5 refills | Status: DC

## 2018-04-13 ENCOUNTER — Other Ambulatory Visit: Payer: Self-pay | Admitting: Gastroenterology

## 2018-05-01 ENCOUNTER — Ambulatory Visit (INDEPENDENT_AMBULATORY_CARE_PROVIDER_SITE_OTHER): Payer: 59 | Admitting: Physician Assistant

## 2018-05-01 ENCOUNTER — Encounter: Payer: Self-pay | Admitting: Physician Assistant

## 2018-05-01 VITALS — BP 116/80 | HR 66 | Temp 98.8°F | Resp 16 | Ht 72.0 in | Wt 224.0 lb

## 2018-05-01 DIAGNOSIS — Z0000 Encounter for general adult medical examination without abnormal findings: Secondary | ICD-10-CM | POA: Diagnosis not present

## 2018-05-01 DIAGNOSIS — I10 Essential (primary) hypertension: Secondary | ICD-10-CM | POA: Diagnosis not present

## 2018-05-01 DIAGNOSIS — Z1231 Encounter for screening mammogram for malignant neoplasm of breast: Secondary | ICD-10-CM | POA: Diagnosis not present

## 2018-05-01 DIAGNOSIS — E7800 Pure hypercholesterolemia, unspecified: Secondary | ICD-10-CM

## 2018-05-01 DIAGNOSIS — E119 Type 2 diabetes mellitus without complications: Secondary | ICD-10-CM | POA: Diagnosis not present

## 2018-05-01 DIAGNOSIS — E559 Vitamin D deficiency, unspecified: Secondary | ICD-10-CM

## 2018-05-01 DIAGNOSIS — Z6830 Body mass index (BMI) 30.0-30.9, adult: Secondary | ICD-10-CM

## 2018-05-01 DIAGNOSIS — Z1239 Encounter for other screening for malignant neoplasm of breast: Secondary | ICD-10-CM

## 2018-05-01 DIAGNOSIS — E538 Deficiency of other specified B group vitamins: Secondary | ICD-10-CM

## 2018-05-01 NOTE — Progress Notes (Signed)
Patient: Alexandra Mora, Female    DOB: November 13, 1960, 57 y.o.   MRN: [id number] Visit Date: 05/01/2018  Today's Provider: Mar Daring, PA-C   Chief Complaint  Patient presents with  . Annual Exam   Subjective:    Annual physical exam Alexandra Mora is a 57 y.o. female who presents today for health maintenance and complete physical. She feels well. She reports exercising 3 days a week. She reports she is sleeping well. 6 04/30/17 CPE 04/30/17 Pap-neg, HPV-neg 08/20/17 Mammogram-BI-RADS 1 06/13/17 Colonoscopy-Polyps, repeat in 5 yrs  -----------------------------------------------------------------   Review of Systems  Constitutional: Negative.   HENT: Negative.   Eyes: Negative.   Respiratory: Negative.   Cardiovascular: Negative.   Gastrointestinal: Negative.   Endocrine: Positive for cold intolerance.  Genitourinary: Negative.   Musculoskeletal: Positive for back pain.  Skin: Negative.   Allergic/Immunologic: Negative.   Neurological: Negative.   Hematological: Negative.   Psychiatric/Behavioral: Negative.     Social History      She  reports that she has never smoked. She has never used smokeless tobacco. She reports that she does not drink alcohol or use drugs.       Social History   Socioeconomic History  . Marital status: Single    Spouse name: Not on file  . Number of children: 1  . Years of education: Not on file  . Highest education level: Not on file  Occupational History  . Not on file  Social Needs  . Financial resource strain: Not on file  . Food insecurity:    Worry: Not on file    Inability: Not on file  . Transportation needs:    Medical: Not on file    Non-medical: Not on file  Tobacco Use  . Smoking status: Never Smoker  . Smokeless tobacco: Never Used  Substance and Sexual Activity  . Alcohol use: No  . Drug use: No  . Sexual activity: Never  Lifestyle  . Physical activity:    Days per week: Not on file    Minutes per  session: Not on file  . Stress: Not on file  Relationships  . Social connections:    Talks on phone: Not on file    Gets together: Not on file    Attends religious service: Not on file    Active member of club or organization: Not on file    Attends meetings of clubs or organizations: Not on file    Relationship status: Not on file  Other Topics Concern  . Not on file  Social History Narrative  . Not on file    Past Medical History:  Diagnosis Date  . Arthritis    lower back  . Benign paroxysmal positional vertigo   . Diabetes mellitus without complication ([place])   . Elevated CK   . GERD (gastroesophageal reflux disease)   . Hypercholesteremia   . Hyperglycemia   . Hypertension   . Insomnia   . Menopausal disorder      Patient Active Problem List   Diagnosis Date Noted  . History of colon polyps   . Polyp of sigmoid colon   . Benign neoplasm of descending colon   . Elevated rheumatoid factor 01/22/2017  . Hypercalcemia 01/22/2017  . Muscle cramps 01/22/2017  . Low back pain 10/04/2015  . Diabetes type 2, controlled ([place]) 07/05/2015  . Hematuria 03/29/2015  . Acute stress disorder 01/25/2015  . Benign paroxysmal positional nystagmus 01/25/2015  .  Elevated CK 01/25/2015  . Big thyroid 01/25/2015  . BP (high blood pressure) 01/25/2015  . Low serum cobalamin 01/25/2015  . Avitaminosis D 01/25/2015  . GERD (gastroesophageal reflux disease) 01/31/2014  . Family history of coronary artery disease 01/31/2014  . Shoulder pain 01/31/2014  . Abnormal EKG 01/31/2014  . Insomnia 03/17/2008  . Family history of colon cancer 09/16/2007  . Hypercholesteremia 01/05/2007    Past Surgical History:  Procedure Laterality Date  . ABDOMINAL HYSTERECTOMY    . COLONOSCOPY WITH PROPOFOL N/A 06/13/2017   Procedure: COLONOSCOPY WITH PROPOFOL;  Surgeon: Lucilla Lame, MD;  Location: [facility];  Service: Gastroenterology;  Laterality: N/A;  . LAPAROSCOPIC CHOLECYSTECTOMY      . POLYPECTOMY  06/13/2017   Procedure: POLYPECTOMY;  Surgeon: Lucilla Lame, MD;  Location: [place];  Service: Gastroenterology;;  . TOTAL ABDOMINAL HYSTERECTOMY W/ BILATERAL SALPINGOOPHORECTOMY      Family History        Family Status  Relation Name Status  . Mother  Alive       stroke  . Father  Alive       diabetes  . Sister  Alive  . Brother  Alive  . Sister  Alive  . Sister  Alive  . Other  (Not Specified)  . Mat Aunt  (Not Specified)  . Mat Aunt  (Not Specified)        Her family history includes Breast cancer in her maternal aunt and maternal aunt; Colon cancer in her other; Diabetes in her other; Heart attack (age of onset: 44) in her mother; Heart disease in her mother; Hyperlipidemia in her mother; Hypertension in her brother, father, mother, sister, sister, and sister; Stroke in her mother.      Allergies  Allergen Reactions  . Dexilant [Dexlansoprazole]     Severe diarrhea  . Statins Other (See Comments)    Increased CK levels     Current Outpatient Medications:  .  Cholecalciferol (VITAMIN D3) 5000 UNITS CAPS, Take 1 capsule by mouth daily., Disp: , Rfl:  .  esomeprazole (NEXIUM) 40 MG capsule, TAKE 1 CAPSULE BY MOUTH DAILY BEFORE BREAKFAST., Disp: 30 capsule, Rfl: 6 .  ezetimibe (ZETIA) 10 MG tablet, TAKE 1 TABLET BY MOUTH DAILY, Disp: 90 tablet, Rfl: 1 .  meloxicam (MOBIC) 15 MG tablet, Take 1 tablet (15 mg total) by mouth as needed., Disp: 90 tablet, Rfl: 1 .  Probiotic Product ([place]) CAPS, Take by mouth., Disp: , Rfl:  .  Vitamin D, Ergocalciferol, (DRISDOL) 50000 units CAPS capsule, TAKE 1 CAPSULE BY MOUTH EVERY 7 DAYS, Disp: 12 capsule, Rfl: 1   Patient Care Team: Mar Daring, PA-C as PCP - General (Family Medicine)      Objective:   Vitals: BP 116/80 (BP Location: Left Arm, Patient Position: Sitting, Cuff Size: Large)   Pulse 66   Temp 98.8 F (37.1 C) (Oral)   Resp 16   Ht 6' (1.829 m)   Wt 224 lb (101.6  kg)   SpO2 99%   BMI 30.38 kg/m    Vitals:   05/01/18 1017  BP: 116/80  Pulse: 66  Resp: 16  Temp: 98.8 F (37.1 C)  TempSrc: Oral  SpO2: 99%  Weight: 224 lb (101.6 kg)  Height: 6' (1.829 m)     Physical Exam  Constitutional: She is oriented to person, place, and time. She appears well-developed and well-nourished. No distress.  HENT:  Head: Normocephalic and atraumatic.  Right Ear: Hearing,  tympanic membrane, external ear and ear canal normal.  Left Ear: Hearing, tympanic membrane, external ear and ear canal normal.  Nose: Nose normal.  Mouth/Throat: Uvula is midline, oropharynx is clear and moist and mucous membranes are normal. No oropharyngeal exudate.  Eyes: Pupils are equal, round, and reactive to light. Conjunctivae and EOM are normal. Right eye exhibits no discharge. Left eye exhibits no discharge. No scleral icterus.  Neck: Normal range of motion. Neck supple. No JVD present. Carotid bruit is not present. No tracheal deviation present. No thyromegaly present.  Cardiovascular: Normal rate, regular rhythm, normal heart sounds and intact distal pulses. Exam reveals no gallop and no friction rub.  No murmur heard. Pulmonary/Chest: Effort normal and breath sounds normal. No respiratory distress. She has no wheezes. She has no rales. She exhibits no tenderness.  Abdominal: Soft. Bowel sounds are normal. She exhibits no distension and no mass. There is no tenderness. There is no rebound and no guarding.  Musculoskeletal: Normal range of motion. She exhibits no edema or tenderness.  Lymphadenopathy:    She has no cervical adenopathy.  Neurological: She is alert and oriented to person, place, and time.  Skin: Skin is warm and dry. No rash noted. She is not diaphoretic.  Psychiatric: She has a normal mood and affect. Her behavior is normal. Judgment and thought content normal.  Vitals reviewed.    Depression Screen PHQ 2/9 Scores 05/01/2018 04/30/2017 04/24/2016 03/29/2015  PHQ  - 2 Score 0 0 0 0  PHQ- 9 Score 0 - - -      Assessment & Plan:     Routine Health Maintenance and Physical Exam  Exercise Activities and Dietary recommendations Goals    . Exercise 150 minutes per week (moderate activity)       Immunization History  Administered Date(s) Administered  . Influenza,inj,Quad PF,6+ Mos 06/17/2015  . Tdap 01/19/2014    Health Maintenance  Topic Date Due  . PNEUMOCOCCAL POLYSACCHARIDE VACCINE AGE 62-64 HIGH RISK  08/05/1964  . OPHTHALMOLOGY EXAM  08/05/1972  . HIV Screening  08/05/1977  . FOOT EXAM  07/04/2016  . HEMOGLOBIN A1C  11/02/2017  . INFLUENZA VACCINE  04/16/2018  . MAMMOGRAM  08/21/2019  . PAP SMEAR  04/30/2020  . COLONOSCOPY  06/13/2022  . TETANUS/TDAP  01/20/2024  . Hepatitis C Screening  Completed     Discussed health benefits of physical activity, and encouraged her to engage in regular exercise appropriate for her age and condition.   1. Annual physical exam Normal physical exam today. Will check labs as below and f/u pending lab results. If labs are stable and WNL she will not need to have these rechecked for one year at her next annual physical exam. She is to call the office in the meantime if she has any acute issue, questions or concerns. - CBC with Differential/Platelet - Comprehensive metabolic panel - Hemoglobin A1c - Lipid Panel With LDL/HDL Ratio - TSH - Vitamin B12 - Vitamin D (25 hydroxy)  2. Breast cancer screening Due in 08/2018. Breast exam normal today. Patient does perform self breast exams.  - MM 3D SCREEN BREAST BILATERAL; Future  3. Controlled type 2 diabetes mellitus without complication, without long-term current use of insulin ([facility]) Diet controlled. Will check labs as below and f/u pending results. - CBC with Differential/Platelet - Comprehensive metabolic panel - Hemoglobin A1c - Lipid Panel With LDL/HDL Ratio - TSH  4. Hypercholesteremia Patient on Zetia 10mg . Will check labs as below  and f/u pending  results. - CBC with Differential/Platelet - Comprehensive metabolic panel - Hemoglobin A1c - Lipid Panel With LDL/HDL Ratio - TSH  5. Avitaminosis D H/O this. On high dose supplementation. On chronic daily PPI. Will check labs as below and f/u pending results. - CBC with Differential/Platelet - Comprehensive metabolic panel - Hemoglobin A1c - Lipid Panel With LDL/HDL Ratio - TSH - Vitamin B12 - Vitamin D (25 hydroxy)  6. Essential hypertension Doing well. Off medication due to weight loss and BP well controlled. Will check labs as below and f/u pending results. - CBC with Differential/Platelet - Comprehensive metabolic panel - Hemoglobin A1c - Lipid Panel With LDL/HDL Ratio - TSH  7. B12 deficiency H/O this. Will check labs as below and f/u pending results. - CBC with Differential/Platelet - Comprehensive metabolic panel - Hemoglobin A1c - Lipid Panel With LDL/HDL Ratio - TSH - Vitamin B12 - Vitamin D (25 hydroxy)  8. BMI 30.0-30.9,adult Doing well. Has made dietary changes and decreased portion sizes. She has lost 23 pounds per our scale in one year. Continue healthy lifestyle modifications.   --------------------------------------------------------------------    Mar Daring, PA-C  [facility]

## 2018-05-01 NOTE — Patient Instructions (Signed)
Health Maintenance for Postmenopausal Women Menopause is a normal process in which your reproductive ability comes to an end. This process happens gradually over a span of months to years, usually between the ages of 22 and 9. Menopause is complete when you have missed 12 consecutive menstrual periods. It is important to talk with your health care provider about some of the most common conditions that affect postmenopausal women, such as heart disease, cancer, and bone loss (osteoporosis). Adopting a healthy lifestyle and getting preventive care can help to promote your health and wellness. Those actions can also lower your chances of developing some of these common conditions. What should I know about menopause? During menopause, you may experience a number of symptoms, such as:  Moderate-to-severe hot flashes.  Night sweats.  Decrease in sex drive.  Mood swings.  Headaches.  Tiredness.  Irritability.  Memory problems.  Insomnia.  Choosing to treat or not to treat menopausal changes is an individual decision that you make with your health care provider. What should I know about hormone replacement therapy and supplements? Hormone therapy products are effective for treating symptoms that are associated with menopause, such as hot flashes and night sweats. Hormone replacement carries certain risks, especially as you become older. If you are thinking about using estrogen or estrogen with progestin treatments, discuss the benefits and risks with your health care provider. What should I know about heart disease and stroke? Heart disease, heart attack, and stroke become more likely as you age. This may be due, in part, to the hormonal changes that your body experiences during menopause. These can affect how your body processes dietary fats, triglycerides, and cholesterol. Heart attack and stroke are both medical emergencies. There are many things that you can do to help prevent heart disease  and stroke:  Have your blood pressure checked at least every 1-2 years. High blood pressure causes heart disease and increases the risk of stroke.  If you are 53-22 years old, ask your health care provider if you should take aspirin to prevent a heart attack or a stroke.  Do not use any tobacco products, including cigarettes, chewing tobacco, or electronic cigarettes. If you need help quitting, ask your health care provider.  It is important to eat a healthy diet and maintain a healthy weight. ? Be sure to include plenty of vegetables, fruits, low-fat dairy products, and lean protein. ? Avoid eating foods that are high in solid fats, added sugars, or salt (sodium).  Get regular exercise. This is one of the most important things that you can do for your health. ? Try to exercise for at least 150 minutes each week. The type of exercise that you do should increase your heart rate and make you sweat. This is known as moderate-intensity exercise. ? Try to do strengthening exercises at least twice each week. Do these in addition to the moderate-intensity exercise.  Know your numbers.Ask your health care provider to check your cholesterol and your blood glucose. Continue to have your blood tested as directed by your health care provider.  What should I know about cancer screening? There are several types of cancer. Take the following steps to reduce your risk and to catch any cancer development as early as possible. Breast Cancer  Practice breast self-awareness. ? This means understanding how your breasts normally appear and feel. ? It also means doing regular breast self-exams. Let your health care provider know about any changes, no matter how small.  If you are 40  or older, have a clinician do a breast exam (clinical breast exam or CBE) every year. Depending on your age, family history, and medical history, it may be recommended that you also have a yearly breast X-ray (mammogram).  If you  have a family history of breast cancer, talk with your health care provider about genetic screening.  If you are at high risk for breast cancer, talk with your health care provider about having an MRI and a mammogram every year.  Breast cancer (BRCA) gene test is recommended for women who have family members with BRCA-related cancers. Results of the assessment will determine the need for genetic counseling and BRCA1 and for BRCA2 testing. BRCA-related cancers include these types: ? Breast. This occurs in males or females. ? Ovarian. ? Tubal. This may also be called fallopian tube cancer. ? Cancer of the abdominal or pelvic lining (peritoneal cancer). ? Prostate. ? Pancreatic.  Cervical, Uterine, and Ovarian Cancer Your health care provider may recommend that you be screened regularly for cancer of the pelvic organs. These include your ovaries, uterus, and vagina. This screening involves a pelvic exam, which includes checking for microscopic changes to the surface of your cervix (Pap test).  For women ages 21-65, health care providers may recommend a pelvic exam and a Pap test every three years. For women ages 79-65, they may recommend the Pap test and pelvic exam, combined with testing for human papilloma virus (HPV), every five years. Some types of HPV increase your risk of cervical cancer. Testing for HPV may also be done on women of any age who have unclear Pap test results.  Other health care providers may not recommend any screening for nonpregnant women who are considered low risk for pelvic cancer and have no symptoms. Ask your health care provider if a screening pelvic exam is right for you.  If you have had past treatment for cervical cancer or a condition that could lead to cancer, you need Pap tests and screening for cancer for at least 20 years after your treatment. If Pap tests have been discontinued for you, your risk factors (such as having a new sexual partner) need to be  reassessed to determine if you should start having screenings again. Some women have medical problems that increase the chance of getting cervical cancer. In these cases, your health care provider may recommend that you have screening and Pap tests more often.  If you have a family history of uterine cancer or ovarian cancer, talk with your health care provider about genetic screening.  If you have vaginal bleeding after reaching menopause, tell your health care provider.  There are currently no reliable tests available to screen for ovarian cancer.  Lung Cancer Lung cancer screening is recommended for adults 69-62 years old who are at high risk for lung cancer because of a history of smoking. A yearly low-dose CT scan of the lungs is recommended if you:  Currently smoke.  Have a history of at least 30 pack-years of smoking and you currently smoke or have quit within the past 15 years. A pack-year is smoking an average of one pack of cigarettes per day for one year.  Yearly screening should:  Continue until it has been 15 years since you quit.  Stop if you develop a health problem that would prevent you from having lung cancer treatment.  Colorectal Cancer  This type of cancer can be detected and can often be prevented.  Routine colorectal cancer screening usually begins at  age 42 and continues through age 45.  If you have risk factors for colon cancer, your health care provider may recommend that you be screened at an earlier age.  If you have a family history of colorectal cancer, talk with your health care provider about genetic screening.  Your health care provider may also recommend using home test kits to check for hidden blood in your stool.  A small camera at the end of a tube can be used to examine your colon directly (sigmoidoscopy or colonoscopy). This is done to check for the earliest forms of colorectal cancer.  Direct examination of the colon should be repeated every  5-10 years until age 71. However, if early forms of precancerous polyps or small growths are found or if you have a family history or genetic risk for colorectal cancer, you may need to be screened more often.  Skin Cancer  Check your skin from head to toe regularly.  Monitor any moles. Be sure to tell your health care provider: ? About any new moles or changes in moles, especially if there is a change in a mole's shape or color. ? If you have a mole that is larger than the size of a pencil eraser.  If any of your family members has a history of skin cancer, especially at a young age, talk with your health care provider about genetic screening.  Always use sunscreen. Apply sunscreen liberally and repeatedly throughout the day.  Whenever you are outside, protect yourself by wearing long sleeves, pants, a wide-brimmed hat, and sunglasses.  What should I know about osteoporosis? Osteoporosis is a condition in which bone destruction happens more quickly than new bone creation. After menopause, you may be at an increased risk for osteoporosis. To help prevent osteoporosis or the bone fractures that can happen because of osteoporosis, the following is recommended:  If you are 46-71 years old, get at least 1,000 mg of calcium and at least 600 mg of vitamin D per day.  If you are older than age 55 but younger than age 65, get at least 1,200 mg of calcium and at least 600 mg of vitamin D per day.  If you are older than age 54, get at least 1,200 mg of calcium and at least 800 mg of vitamin D per day.  Smoking and excessive alcohol intake increase the risk of osteoporosis. Eat foods that are rich in calcium and vitamin D, and do weight-bearing exercises several times each week as directed by your health care provider. What should I know about how menopause affects my mental health? Depression may occur at any age, but it is more common as you become older. Common symptoms of depression  include:  Low or sad mood.  Changes in sleep patterns.  Changes in appetite or eating patterns.  Feeling an overall lack of motivation or enjoyment of activities that you previously enjoyed.  Frequent crying spells.  Talk with your health care provider if you think that you are experiencing depression. What should I know about immunizations? It is important that you get and maintain your immunizations. These include:  Tetanus, diphtheria, and pertussis (Tdap) booster vaccine.  Influenza every year before the flu season begins.  Pneumonia vaccine.  Shingles vaccine.  Your health care provider may also recommend other immunizations. This information is not intended to replace advice given to you by your health care provider. Make sure you discuss any questions you have with your health care provider. Document Released: 10/25/2005  Document Revised: 03/22/2016 Document Reviewed: 06/06/2015 Elsevier Interactive Patient Education  2018 Elsevier Inc.  

## 2018-05-02 LAB — VITAMIN B12: VITAMIN B 12: 1334 pg/mL — AB (ref 232–1245)

## 2018-05-02 LAB — CBC WITH DIFFERENTIAL/PLATELET
BASOS: 1 %
Basophils Absolute: 0 10*3/uL (ref 0.0–0.2)
EOS (ABSOLUTE): 0.1 10*3/uL (ref 0.0–0.4)
EOS: 3 %
HEMATOCRIT: 39.3 % (ref 34.0–46.6)
Hemoglobin: 12.6 g/dL (ref 11.1–15.9)
Immature Grans (Abs): 0 10*3/uL (ref 0.0–0.1)
Immature Granulocytes: 0 %
LYMPHS ABS: 2.8 10*3/uL (ref 0.7–3.1)
Lymphs: 59 %
MCH: 25 pg — ABNORMAL LOW (ref 26.6–33.0)
MCHC: 32.1 g/dL (ref 31.5–35.7)
MCV: 78 fL — AB (ref 79–97)
MONOS ABS: 0.2 10*3/uL (ref 0.1–0.9)
Monocytes: 3 %
Neutrophils Absolute: 1.6 10*3/uL (ref 1.4–7.0)
Neutrophils: 34 %
Platelets: 273 10*3/uL (ref 150–450)
RBC: 5.05 x10E6/uL (ref 3.77–5.28)
RDW: 14.7 % (ref 12.3–15.4)
WBC: 4.7 10*3/uL (ref 3.4–10.8)

## 2018-05-02 LAB — COMPREHENSIVE METABOLIC PANEL
A/G RATIO: 1.9 (ref 1.2–2.2)
ALK PHOS: 49 IU/L (ref 39–117)
ALT: 23 IU/L (ref 0–32)
AST: 19 IU/L (ref 0–40)
Albumin: 5 g/dL (ref 3.5–5.5)
BILIRUBIN TOTAL: 0.4 mg/dL (ref 0.0–1.2)
BUN/Creatinine Ratio: 12 (ref 9–23)
BUN: 14 mg/dL (ref 6–24)
CO2: 27 mmol/L (ref 20–29)
Calcium: 10.4 mg/dL — ABNORMAL HIGH (ref 8.7–10.2)
Chloride: 102 mmol/L (ref 96–106)
Creatinine, Ser: 1.13 mg/dL — ABNORMAL HIGH (ref 0.57–1.00)
GFR calc Af Amer: 63 mL/min/{1.73_m2} (ref >59)
GFR calc non Af Amer: 55 mL/min/{1.73_m2} — ABNORMAL LOW (ref >59)
GLOBULIN, TOTAL: 2.7 g/dL (ref 1.5–4.5)
Glucose: 89 mg/dL (ref 65–99)
POTASSIUM: 4.4 mmol/L (ref 3.5–5.2)
SODIUM: 143 mmol/L (ref 134–144)
Total Protein: 7.7 g/dL (ref 6.0–8.5)

## 2018-05-02 LAB — VITAMIN D 25 HYDROXY (VIT D DEFICIENCY, FRACTURES): Vit D, 25-Hydroxy: 74.1 ng/mL (ref 30.0–100.0)

## 2018-05-02 LAB — LIPID PANEL WITH LDL/HDL RATIO
Cholesterol, Total: 194 mg/dL (ref 100–199)
HDL: 52 mg/dL (ref >39)
LDL Calculated: 124 mg/dL — ABNORMAL HIGH (ref 0–99)
LDl/HDL Ratio: 2.4 ratio (ref 0.0–3.2)
Triglycerides: 90 mg/dL (ref 0–149)
VLDL CHOLESTEROL CAL: 18 mg/dL (ref 5–40)

## 2018-05-02 LAB — HEMOGLOBIN A1C
ESTIMATED AVERAGE GLUCOSE: 126 mg/dL
HEMOGLOBIN A1C: 6 % — AB (ref 4.8–5.6)

## 2018-05-02 LAB — TSH: TSH: 3.61 u[IU]/mL (ref 0.450–4.500)

## 2018-07-22 ENCOUNTER — Other Ambulatory Visit: Payer: Self-pay | Admitting: Physician Assistant

## 2018-08-24 ENCOUNTER — Ambulatory Visit
Admission: RE | Admit: 2018-08-24 | Discharge: 2018-08-24 | Disposition: A | Discharge status: Home / Self Care | Payer: 59 | Source: Ambulatory Visit | Attending: Physician Assistant | Admitting: Physician Assistant

## 2018-08-24 ENCOUNTER — Telehealth: Payer: Self-pay

## 2018-08-24 DIAGNOSIS — Z1239 Encounter for other screening for malignant neoplasm of breast: Secondary | ICD-10-CM | POA: Diagnosis not present

## 2018-08-24 DIAGNOSIS — Z1231 Encounter for screening mammogram for malignant neoplasm of breast: Secondary | ICD-10-CM | POA: Diagnosis not present

## 2018-08-24 NOTE — Telephone Encounter (Signed)
Patient was advised.  

## 2018-08-24 NOTE — Telephone Encounter (Signed)
-----   Message from Mar Daring, [place] sent at 08/24/2018 10:25 AM EST ----- Normal mammogram. Repeat screening in one year.

## 2018-09-07 ENCOUNTER — Other Ambulatory Visit: Payer: Self-pay | Admitting: Physician Assistant

## 2018-09-07 DIAGNOSIS — E7800 Pure hypercholesterolemia, unspecified: Secondary | ICD-10-CM

## 2018-11-18 DIAGNOSIS — H524 Presbyopia: Secondary | ICD-10-CM | POA: Diagnosis not present

## 2018-12-30 ENCOUNTER — Other Ambulatory Visit: Payer: Self-pay | Admitting: Physician Assistant

## 2018-12-30 DIAGNOSIS — E7800 Pure hypercholesterolemia, unspecified: Secondary | ICD-10-CM

## 2018-12-30 MED FILL — VIT D2 1.25 MG (50,000 UNIT: 1.25 MG | 84 days supply | Qty: 12 | Fill #0

## 2018-12-30 MED FILL — ESOMEPRAZOLE MAG DR 40 MG C: 40 | 30 days supply | Qty: 30 | Fill #0

## 2019-02-25 ENCOUNTER — Other Ambulatory Visit: Payer: Self-pay | Admitting: Gastroenterology

## 2019-05-07 ENCOUNTER — Encounter: Payer: Self-pay | Admitting: Physician Assistant

## 2019-05-27 NOTE — Progress Notes (Signed)
Patient: Alexandra Mora, Female    DOB: Oct 15, 1960, 58 y.o.   MRN: [id number] Visit Date: 05/28/2019  Today's Provider: Mar Daring, PA-C   Chief Complaint  Patient presents with  . Annual Exam   Subjective:     Annual physical exam Alexandra Mora is a 58 y.o. female who presents today for health maintenance and complete physical. She feels fairly well. She reports exercising none. She reports she is sleeping poorly. -----------------------------------------------------------  Pap:04/30/17-Normal, HPV-Neg Mammogram:08/24/18-Normal Had Eye exam at [facility] in March, requested records  Review of Systems  Constitutional: Negative.   HENT: Negative.   Eyes: Negative.   Respiratory: Negative.   Gastrointestinal: Positive for abdominal pain and constipation.  Endocrine: Negative.   Genitourinary: Negative.   Musculoskeletal: Positive for back pain.  Skin: Negative.   Allergic/Immunologic: Negative.   Neurological: Negative.   Hematological: Negative.   Psychiatric/Behavioral: Positive for sleep disturbance.    Social History      She  reports that she has never smoked. She has never used smokeless tobacco. She reports that she does not drink alcohol or use drugs.       Social History   Socioeconomic History  . Marital status: Single    Spouse name: Not on file  . Number of children: 1  . Years of education: Not on file  . Highest education level: Not on file  Occupational History  . Not on file  Social Needs  . Financial resource strain: Not on file  . Food insecurity    Worry: Not on file    Inability: Not on file  . Transportation needs    Medical: Not on file    Non-medical: Not on file  Tobacco Use  . Smoking status: Never Smoker  . Smokeless tobacco: Never Used  Substance and Sexual Activity  . Alcohol use: No  . Drug use: No  . Sexual activity: Never  Lifestyle  . Physical activity    Days per week: Not on file    Minutes per  session: Not on file  . Stress: Not on file  Relationships  . Social Herbalist on phone: Not on file    Gets together: Not on file    Attends religious service: Not on file    Active member of club or organization: Not on file    Attends meetings of clubs or organizations: Not on file    Relationship status: Not on file  Other Topics Concern  . Not on file  Social History Narrative  . Not on file    Past Medical History:  Diagnosis Date  . Arthritis    lower back  . Benign paroxysmal positional vertigo   . Diabetes mellitus without complication ([place])   . Elevated CK   . GERD (gastroesophageal reflux disease)   . Hypercholesteremia   . Hyperglycemia   . Hypertension   . Insomnia   . Menopausal disorder      Patient Active Problem List   Diagnosis Date Noted  . History of colon polyps   . Polyp of sigmoid colon   . Benign neoplasm of descending colon   . Elevated rheumatoid factor 01/22/2017  . Hypercalcemia 01/22/2017  . Muscle cramps 01/22/2017  . Low back pain 10/04/2015  . Diabetes type 2, controlled ([place]) 07/05/2015  . Hematuria 03/29/2015  . Acute stress disorder 01/25/2015  . Benign paroxysmal positional nystagmus 01/25/2015  . Elevated CK 01/25/2015  .  Big thyroid 01/25/2015  . BP (high blood pressure) 01/25/2015  . Low serum cobalamin 01/25/2015  . Avitaminosis D 01/25/2015  . GERD (gastroesophageal reflux disease) 01/31/2014  . Family history of coronary artery disease 01/31/2014  . Shoulder pain 01/31/2014  . Abnormal EKG 01/31/2014  . Insomnia 03/17/2008  . Family history of colon cancer 09/16/2007  . Hypercholesteremia 01/05/2007    Past Surgical History:  Procedure Laterality Date  . ABDOMINAL HYSTERECTOMY    . COLONOSCOPY WITH PROPOFOL N/A 06/13/2017   Procedure: COLONOSCOPY WITH PROPOFOL;  Surgeon: Lucilla Lame, MD;  Location: [place];  Service: Gastroenterology;  Laterality: N/A;  . LAPAROSCOPIC CHOLECYSTECTOMY    .  POLYPECTOMY  06/13/2017   Procedure: POLYPECTOMY;  Surgeon: Lucilla Lame, MD;  Location: [place];  Service: Gastroenterology;;  . TOTAL ABDOMINAL HYSTERECTOMY W/ BILATERAL SALPINGOOPHORECTOMY      Family History        Family Status  Relation Name Status  . Mother  Alive       stroke  . Father  Alive       diabetes  . Sister  Alive  . Brother  Alive  . Sister  Alive  . Sister  Alive  . Other  (Not Specified)  . Mat Aunt  (Not Specified)  . Mat Aunt  (Not Specified)        Her family history includes Breast cancer in her maternal aunt and maternal aunt; Colon cancer in an other family member; Diabetes in an other family member; Heart attack (age of onset: 17) in her mother; Heart disease in her mother; Hyperlipidemia in her mother; Hypertension in her brother, father, mother, sister, sister, and sister; Stroke in her mother.      Allergies  Allergen Reactions  . Dexilant [Dexlansoprazole]     Severe diarrhea  . Statins Other (See Comments)    Increased CK levels     Current Outpatient Medications:  .  Cholecalciferol (VITAMIN D3) 5000 UNITS CAPS, Take 1 capsule by mouth daily., Disp: , Rfl:  .  cyanocobalamin 1000 MCG tablet, Take 1,000 mcg by mouth daily., Disp: , Rfl:  .  dicyclomine (BENTYL) 20 MG tablet, Take 20 mg by mouth 4 (four) times daily -  before meals and at bedtime., Disp: , Rfl:  .  Docusate Sodium 100 MG capsule, Take 100 mg by mouth as needed for constipation., Disp: , Rfl:  .  esomeprazole (NEXIUM) 40 MG capsule, TAKE 1 CAPSULE BY MOUTH DAILY BEFORE BREAKFAST., Disp: 30 capsule, Rfl: 6 .  ezetimibe (ZETIA) 10 MG tablet, TAKE 1 TABLET BY MOUTH DAILY, Disp: 90 tablet, Rfl: 1 .  lisinopril (ZESTRIL) 10 MG tablet, Take 10 mg by mouth daily., Disp: , Rfl:  .  meloxicam (MOBIC) 15 MG tablet, Take 1 tablet (15 mg total) by mouth as needed., Disp: 90 tablet, Rfl: 1 .  methocarbamol (ROBAXIN) 500 MG tablet, Take 500 mg by mouth 2 (two) times daily as  needed for muscle spasms., Disp: , Rfl:  .  Multiple Vitamin (MULTIVITAMIN PO), Take by mouth daily., Disp: , Rfl:  .  Probiotic Product ([place]) CAPS, Take by mouth., Disp: , Rfl:  .  senna-docusate (STOOL SOFTENER/LAXATIVE) 8.6-50 MG tablet, Take 1 tablet by mouth daily., Disp: , Rfl:  .  Vitamin D, Ergocalciferol, (DRISDOL) 1.25 MG (50000 UT) CAPS capsule, TAKE 1 CAPSULE BY MOUTH EVERY 7 DAYS, Disp: 12 capsule, Rfl: 1 .  zolpidem (AMBIEN CR) 12.5 MG CR tablet, Take 12.5  mg by mouth at bedtime as needed for sleep., Disp: , Rfl:    Patient Care Team: Rubye Beach as PCP - General (Family Medicine)    Objective:    Vitals: BP 128/87 (BP Location: Right Arm, Patient Position: Sitting, Cuff Size: Large)   Pulse 83   Temp (!) 96.9 F (36.1 C) (Other (Comment))   Resp 16   Ht 6' (1.829 m)   Wt 248 lb (112.5 kg)   SpO2 97%   BMI 33.63 kg/m    Vitals:   05/28/19 0909  BP: 128/87  Pulse: 83  Resp: 16  Temp: (!) 96.9 F (36.1 C)  TempSrc: Other (Comment)  SpO2: 97%  Weight: 248 lb (112.5 kg)  Height: 6' (1.829 m)     Physical Exam Vitals signs reviewed.  Constitutional:      General: She is not in acute distress.    Appearance: Normal appearance. She is well-developed. She is obese. She is not ill-appearing or diaphoretic.  HENT:     Head: Normocephalic and atraumatic.     Right Ear: Tympanic membrane, ear canal and external ear normal.     Left Ear: Tympanic membrane, ear canal and external ear normal.     Nose: Nose normal.     Mouth/Throat:     Mouth: Mucous membranes are moist.     Pharynx: No oropharyngeal exudate.  Eyes:     General: No scleral icterus.       Right eye: No discharge.        Left eye: No discharge.     Extraocular Movements: Extraocular movements intact.     Conjunctiva/sclera: Conjunctivae normal.     Pupils: Pupils are equal, round, and reactive to light.  Neck:     Musculoskeletal: Normal range of motion and neck  supple.     Thyroid: No thyromegaly.     Vascular: No carotid bruit or JVD.     Trachea: No tracheal deviation.  Cardiovascular:     Rate and Rhythm: Normal rate and regular rhythm.     Pulses: Normal pulses.     Heart sounds: Normal heart sounds. No murmur. No friction rub. No gallop.   Pulmonary:     Effort: Pulmonary effort is normal. No respiratory distress.     Breath sounds: Normal breath sounds. No wheezing or rales.  Chest:     Chest wall: No tenderness.  Abdominal:     General: Abdomen is flat. Bowel sounds are normal. There is no distension.     Palpations: Abdomen is soft. There is no mass.     Tenderness: There is no abdominal tenderness. There is no guarding or rebound.  Musculoskeletal: Normal range of motion.        General: No tenderness.     Right lower leg: No edema.     Left lower leg: No edema.  Lymphadenopathy:     Cervical: No cervical adenopathy.  Skin:    General: Skin is warm and dry.     Capillary Refill: Capillary refill takes less than 2 seconds.     Findings: No rash.  Neurological:     General: No focal deficit present.     Mental Status: She is alert and oriented to person, place, and time. Mental status is at baseline.     Cranial Nerves: No cranial nerve deficit.     Motor: No weakness.     Coordination: Coordination normal.     Gait: Gait normal.  Psychiatric:  Mood and Affect: Mood normal.        Behavior: Behavior normal.        Thought Content: Thought content normal.        Judgment: Judgment normal.     Diabetic Foot Exam - Simple   Simple Foot Form Diabetic Foot exam was performed with the following findings: Yes 05/28/2019 10:07 AM  Visual Inspection No deformities, no ulcerations, no other skin breakdown bilaterally: Yes Sensation Testing Intact to touch and monofilament testing bilaterally: Yes Pulse Check Posterior Tibialis and Dorsalis pulse intact bilaterally: Yes Comments    Depression Screen PHQ 2/9 Scores  05/01/2018 04/30/2017 04/24/2016 03/29/2015  PHQ - 2 Score 0 0 0 0  PHQ- 9 Score 0 - - -       Assessment & Plan:     Routine Health Maintenance and Physical Exam  Exercise Activities and Dietary recommendations Goals    . Exercise 150 minutes per week (moderate activity)       Immunization History  Administered Date(s) Administered  . Influenza,inj,Quad PF,6+ Mos 06/17/2015  . Tdap 01/19/2014    Health Maintenance  Topic Date Due  . PNEUMOCOCCAL POLYSACCHARIDE VACCINE AGE 40-64 HIGH RISK  08/05/1964  . OPHTHALMOLOGY EXAM  08/05/1972  . HIV Screening  08/05/1977  . FOOT EXAM  07/04/2016  . URINE MICROALBUMIN  07/04/2016  . HEMOGLOBIN A1C  11/01/2018  . INFLUENZA VACCINE  04/17/2019  . PAP SMEAR-Modifier  04/30/2020  . MAMMOGRAM  08/24/2020  . COLONOSCOPY  06/13/2022  . TETANUS/TDAP  01/20/2024  . Hepatitis C Screening  Completed     Discussed health benefits of physical activity, and encouraged her to engage in regular exercise appropriate for her age and condition.    1. Annual physical exam Normal physical exam today. Will check labs as below and f/u pending lab results. If labs are stable and WNL she will not need to have these rechecked for one year at her next annual physical exam. She is to call the office in the meantime if she has any acute issue, questions or concerns. - CBC w/Diff/Platelet - Comprehensive Metabolic Panel (CMET) - TSH - Lipid Profile - HgB A1c  2. Breast cancer screening Breast exam today was normal. There is no family history of breast cancer. She does perform regular self breast exams. Mammogram was ordered as below. Information for [facility] Breast clinic was given to patient so she may schedule her mammogram at her convenience.  3. Need for influenza vaccination Flu vaccine given today without complication. Patient sat upright for 15 minutes to check for adverse reaction before being released. - Flu Vaccine QUAD 36+ mos IM  4. Essential  hypertension Stable. Continue lisinopril as below. Will check labs as below and f/u pending results. - CBC w/Diff/Platelet - Comprehensive Metabolic Panel (CMET) - TSH - Lipid Profile - HgB A1c - lisinopril (ZESTRIL) 10 MG tablet; Take 1 tablet (10 mg total) by mouth daily.  Dispense: 90 tablet; Refill: 1  5. Controlled type 2 diabetes mellitus without complication, without long-term current use of insulin (HCC) Diet controlled. Pneumococcal 23 given today. Will check labs as below and f/u pending results. - CBC w/Diff/Platelet - Comprehensive Metabolic Panel (CMET) - HgB A1c - Pneumococcal polysaccharide vaccine 23-valent greater than or equal to 2yo subcutaneous/IM  6. Avitaminosis D H/O this. Will check labs as below and f/u pending results. - CBC w/Diff/Platelet - Comprehensive Metabolic Panel (CMET) - Vitamin D (25 hydroxy)  7. Hypercholesteremia Stable. Continue Zetia 10mg . Will  check labs as below and f/u pending results. - CBC w/Diff/Platelet - Comprehensive Metabolic Panel (CMET) - Lipid Profile - HgB A1c  8. Class 1 obesity due to excess calories with serious comorbidity and body mass index (BMI) of 33.0 to 33.9 in adult Counseled patient on healthy lifestyle modifications including dieting and exercise.  - Comprehensive Metabolic Panel (CMET) - TSH - Lipid Profile - HgB A1c  9. Asymptomatic microscopic hematuria UA still with hematuria, microscopic. Does have some ketones and protein so will send for microscopic eval as below.  - POCT Urinalysis Dipstick - Urinalysis, microscopic only  10. Primary insomnia Restarted ambien. Diagnosis pulled for medication refill. Continue current medical treatment plan. - zolpidem (AMBIEN CR) 12.5 MG CR tablet; Take 1 tablet (12.5 mg total) by mouth at bedtime as needed for sleep.  Dispense: 90 tablet; Refill: 1  --------------------------------------------------------------------    Mar Daring, PA-C  [facility]

## 2019-05-28 ENCOUNTER — Other Ambulatory Visit: Payer: Self-pay

## 2019-05-28 ENCOUNTER — Ambulatory Visit (INDEPENDENT_AMBULATORY_CARE_PROVIDER_SITE_OTHER): Payer: 59 | Admitting: Physician Assistant

## 2019-05-28 ENCOUNTER — Encounter: Payer: Self-pay | Admitting: Physician Assistant

## 2019-05-28 VITALS — BP 128/87 | HR 83 | Temp 96.9°F | Resp 16 | Ht 72.0 in | Wt 248.0 lb

## 2019-05-28 DIAGNOSIS — I10 Essential (primary) hypertension: Secondary | ICD-10-CM

## 2019-05-28 DIAGNOSIS — E559 Vitamin D deficiency, unspecified: Secondary | ICD-10-CM

## 2019-05-28 DIAGNOSIS — Z6833 Body mass index (BMI) 33.0-33.9, adult: Secondary | ICD-10-CM | POA: Diagnosis not present

## 2019-05-28 DIAGNOSIS — E7800 Pure hypercholesterolemia, unspecified: Secondary | ICD-10-CM

## 2019-05-28 DIAGNOSIS — E119 Type 2 diabetes mellitus without complications: Secondary | ICD-10-CM

## 2019-05-28 DIAGNOSIS — Z1239 Encounter for other screening for malignant neoplasm of breast: Secondary | ICD-10-CM | POA: Diagnosis not present

## 2019-05-28 DIAGNOSIS — F5101 Primary insomnia: Secondary | ICD-10-CM

## 2019-05-28 DIAGNOSIS — R3121 Asymptomatic microscopic hematuria: Secondary | ICD-10-CM

## 2019-05-28 DIAGNOSIS — Z0000 Encounter for general adult medical examination without abnormal findings: Secondary | ICD-10-CM

## 2019-05-28 DIAGNOSIS — Z23 Encounter for immunization: Secondary | ICD-10-CM | POA: Diagnosis not present

## 2019-05-28 DIAGNOSIS — E6609 Other obesity due to excess calories: Secondary | ICD-10-CM

## 2019-05-28 LAB — POCT URINALYSIS DIPSTICK
Appearance: NORMAL
Bilirubin, UA: NEGATIVE
Glucose, UA: NEGATIVE
Leukocytes, UA: NEGATIVE
Nitrite, UA: NEGATIVE
Odor: NORMAL
Protein, UA: POSITIVE — AB
Spec Grav, UA: 1.025 (ref 1.010–1.025)
Urobilinogen, UA: 0.2 E.U./dL
pH, UA: 6 (ref 5.0–8.0)

## 2019-05-28 MED ORDER — LISINOPRIL 10 MG PO TABS: 10.0000 mg | ORAL_TABLET | Freq: Every day | ORAL | 1 refills | Status: DC

## 2019-05-28 MED ORDER — ZOLPIDEM TARTRATE ER 12.5 MG PO TBCR: 12.5000 mg | EXTENDED_RELEASE_TABLET | Freq: Every evening | ORAL | 1 refills | Status: DC | PRN

## 2019-05-28 NOTE — Patient Instructions (Signed)

## 2019-05-29 LAB — LIPID PANEL
Chol/HDL Ratio: 4.4 ratio (ref 0.0–4.4)
Cholesterol, Total: 220 mg/dL — ABNORMAL HIGH (ref 100–199)
HDL: 50 mg/dL (ref >39)
LDL Chol Calc (NIH): 147 mg/dL — ABNORMAL HIGH (ref 0–99)
Triglycerides: 127 mg/dL (ref 0–149)
VLDL Cholesterol Cal: 23 mg/dL (ref 5–40)

## 2019-05-29 LAB — COMPREHENSIVE METABOLIC PANEL
ALT: 30 IU/L (ref 0–32)
AST: 22 IU/L (ref 0–40)
Albumin/Globulin Ratio: 1.8 (ref 1.2–2.2)
Albumin: 4.5 g/dL (ref 3.8–4.9)
Alkaline Phosphatase: 52 IU/L (ref 39–117)
BUN/Creatinine Ratio: 14 (ref 9–23)
BUN: 13 mg/dL (ref 6–24)
Bilirubin Total: 0.3 mg/dL (ref 0.0–1.2)
CO2: 22 mmol/L (ref 20–29)
Calcium: 9.9 mg/dL (ref 8.7–10.2)
Chloride: 105 mmol/L (ref 96–106)
Creatinine, Ser: 0.92 mg/dL (ref 0.57–1.00)
GFR calc Af Amer: 80 mL/min/{1.73_m2} (ref >59)
GFR calc non Af Amer: 70 mL/min/{1.73_m2} (ref >59)
Globulin, Total: 2.5 g/dL (ref 1.5–4.5)
Glucose: 87 mg/dL (ref 65–99)
Potassium: 4.3 mmol/L (ref 3.5–5.2)
Sodium: 143 mmol/L (ref 134–144)
Total Protein: 7 g/dL (ref 6.0–8.5)

## 2019-05-29 LAB — CBC WITH DIFFERENTIAL/PLATELET
Basophils Absolute: 0 10*3/uL (ref 0.0–0.2)
Basos: 1 %
EOS (ABSOLUTE): 0.1 10*3/uL (ref 0.0–0.4)
Eos: 1 %
Hematocrit: 36.3 % (ref 34.0–46.6)
Hemoglobin: 12 g/dL (ref 11.1–15.9)
Immature Grans (Abs): 0 10*3/uL (ref 0.0–0.1)
Immature Granulocytes: 0 %
Lymphocytes Absolute: 2.5 10*3/uL (ref 0.7–3.1)
Lymphs: 53 %
MCH: 25.1 pg — ABNORMAL LOW (ref 26.6–33.0)
MCHC: 33.1 g/dL (ref 31.5–35.7)
MCV: 76 fL — ABNORMAL LOW (ref 79–97)
Monocytes Absolute: 0.3 10*3/uL (ref 0.1–0.9)
Monocytes: 6 %
Neutrophils Absolute: 1.8 10*3/uL (ref 1.4–7.0)
Neutrophils: 39 %
Platelets: 272 10*3/uL (ref 150–450)
RBC: 4.79 x10E6/uL (ref 3.77–5.28)
RDW: 14.3 % (ref 11.7–15.4)
WBC: 4.8 10*3/uL (ref 3.4–10.8)

## 2019-05-29 LAB — HEMOGLOBIN A1C
Est. average glucose Bld gHb Est-mCnc: 126 mg/dL
Hgb A1c MFr Bld: 6 % — ABNORMAL HIGH (ref 4.8–5.6)

## 2019-05-29 LAB — URINALYSIS, MICROSCOPIC ONLY: Casts: NONE SEEN /lpf

## 2019-05-29 LAB — TSH: TSH: 2.76 u[IU]/mL (ref 0.450–4.500)

## 2019-05-29 LAB — VITAMIN D 25 HYDROXY (VIT D DEFICIENCY, FRACTURES): Vit D, 25-Hydroxy: 55.6 ng/mL (ref 30.0–100.0)

## 2019-05-30 ENCOUNTER — Encounter: Payer: Self-pay | Admitting: Physician Assistant

## 2019-05-31 ENCOUNTER — Encounter: Payer: Self-pay | Admitting: Physician Assistant

## 2019-06-01 ENCOUNTER — Other Ambulatory Visit: Payer: Self-pay | Admitting: Gastroenterology

## 2019-06-01 MED ORDER — PANTOPRAZOLE SODIUM 40 MG PO TBEC: 40.0000 mg | DELAYED_RELEASE_TABLET | Freq: Every day | ORAL | 11 refills | Status: DC

## 2019-06-21 ENCOUNTER — Other Ambulatory Visit: Payer: Self-pay | Admitting: Physician Assistant

## 2019-07-08 ENCOUNTER — Other Ambulatory Visit: Payer: Self-pay | Admitting: Physician Assistant

## 2019-07-08 DIAGNOSIS — Z1231 Encounter for screening mammogram for malignant neoplasm of breast: Secondary | ICD-10-CM

## 2019-08-26 ENCOUNTER — Ambulatory Visit
Admission: RE | Admit: 2019-08-26 | Discharge: 2019-08-26 | Disposition: A | Discharge status: Home / Self Care | Payer: 59 | Source: Ambulatory Visit | Attending: Physician Assistant | Admitting: Physician Assistant

## 2019-08-26 DIAGNOSIS — Z1231 Encounter for screening mammogram for malignant neoplasm of breast: Secondary | ICD-10-CM | POA: Insufficient documentation

## 2019-09-15 ENCOUNTER — Other Ambulatory Visit: Payer: Self-pay | Admitting: Physician Assistant

## 2019-09-15 NOTE — Telephone Encounter (Signed)
Requested medication (s) are due for refill today: yes  Requested medication (s) are on the active medication list: yes  Last refill:  06/21/2019  Future visit scheduled: no  Notes to clinic:  50,000 IU strengths are not delegated   Requested Prescriptions  Pending Prescriptions Disp Refills   Vitamin D, Ergocalciferol, (DRISDOL) 1.25 MG (50000 UT) CAPS capsule [Pharmacy Med Name: VIT D2 1.25 MG (50,000 UNIT 1.25 MG Capsule] 12 capsule 0    Sig: TAKE 1 CAPSULE BY MOUTH EVERY 7 DAYS      Endocrinology:  Vitamins - Vitamin D Supplementation Failed - 09/15/2019  7:31 AM      Failed - 50,000 IU strengths are not delegated      Failed - Phosphate in normal range and within 360 days    No results found for: PHOS        Passed - Ca in normal range and within 360 days    Calcium  Date Value Ref Range Status  05/28/2019 9.9 8.7 - 10.2 mg/dL Final   Calcium, Total  Date Value Ref Range Status  07/03/2014 9.7 8.5 - 10.1 mg/dL Final          Passed - Vitamin D in normal range and within 360 days    Vit D, 25-Hydroxy  Date Value Ref Range Status  05/28/2019 55.6 30.0 - 100.0 ng/mL Final    Comment:    Vitamin D deficiency has been defined by the Institute of Medicine and an Endocrine Society practice guideline as a level of serum 25-OH vitamin D less than 20 ng/mL (1,2). The Endocrine Society went on to further define vitamin D insufficiency as a level between 21 and 29 ng/mL (2). 1. IOM (Institute of Medicine). 2010. Dietary reference    intakes for calcium and D. [place]: The    [employer]. 2. Holick MF, Binkley [place], Bischoff-Ferrari HA, et al.    Evaluation, treatment, and prevention of vitamin D    deficiency: an Endocrine Society clinical practice    guideline. JCEM. 2011 Jul; 96(7):1911-30.           Passed - Valid encounter within last 12 months    Recent Outpatient Visits           3 months ago Annual physical exam   [facility]  [place], Clearnce Sorrel, [place]   1 year ago Annual physical exam   [facility] [place], [place]arnce Sor[place], [place]   2 years ago Annual physical exam   [place], Clearnce Sorrel, [place]   2 years ago Muscle spasm   [facility] [place], [place]   2 years ago Chest discomfort   [facility] [place], [place]

## 2019-09-27 ENCOUNTER — Other Ambulatory Visit: Payer: Self-pay | Admitting: Physician Assistant

## 2019-09-27 DIAGNOSIS — E7800 Pure hypercholesterolemia, unspecified: Secondary | ICD-10-CM

## 2019-11-19 DIAGNOSIS — H5202 Hypermetropia, left eye: Secondary | ICD-10-CM | POA: Diagnosis not present

## 2019-11-19 DIAGNOSIS — H52223 Regular astigmatism, bilateral: Secondary | ICD-10-CM | POA: Diagnosis not present

## 2019-11-19 DIAGNOSIS — H524 Presbyopia: Secondary | ICD-10-CM | POA: Diagnosis not present

## 2019-12-03 ENCOUNTER — Other Ambulatory Visit: Payer: Self-pay | Admitting: Physician Assistant

## 2019-12-03 DIAGNOSIS — I10 Essential (primary) hypertension: Secondary | ICD-10-CM

## 2019-12-03 NOTE — Telephone Encounter (Signed)
Courtesy refill. Sent message for pt. To make appointment. °

## 2019-12-20 DIAGNOSIS — H40013 Open angle with borderline findings, low risk, bilateral: Secondary | ICD-10-CM | POA: Diagnosis not present

## 2020-01-07 NOTE — Progress Notes (Signed)
Established patient visit   Patient: Alexandra Mora   DOB: 11-Sep-1961   59 y.o. Female  MRN: [id number] Visit Date: 01/14/2020  Today's healthcare provider: Mar Daring, PA-C   Chief Complaint  Patient presents with  . Follow-up    HTN and Diabetes   Subjective    HPI Diabetes Mellitus Type II, follow-up  Lab Results  Component Value Date   HGBA1C 6.0 (H) 05/28/2019   HGBA1C 6.0 (H) 05/01/2018   HGBA1C 6.2 (H) 05/02/2017   Last seen for diabetes 7 months ago.  Management since then includes continuing the same treatment. She reports excellent compliance with treatment. She is not having side effects.   Home blood sugar records:   Episodes of hypoglycemia? No   Current insulin regiment: none Most Recent Eye Exam: patient aware to schedule  ----------------------------------------------------------------------------------------- Hypertension, follow-up  BP Readings from Last 3 Encounters:  01/14/20 115/78  05/28/19 128/87  05/01/18 116/80   She was last seen for hypertension 7 months ago.  BP at that visit was 128/87. Management since that visit includes continuing the same treatment. She reports excellent compliance with treatment. She is not having side effects.  She is exercising. She is adherent to low salt diet.   Outside blood pressures are 117/58-120's/50-70's.  She does not smoke.   ----------------------------------------------------------------------------------------- Lipid/Cholesterol, follow-up  Last Lipid Panel: Lab Results  Component Value Date   CHOL 220 (H) 05/28/2019   LDLCALC 147 (H) 05/28/2019   HDL 50 05/28/2019   TRIG 127 05/28/2019   ALT 30 05/28/2019   AST 22 05/28/2019   PLT 272 05/28/2019    She was last seen for this 7 months ago.  Management since that visit includes continuing the same treatment . She stopped her Cholesterol medication about 2 weeks ago.   Symptoms: No appetite changes No foot  ulcerations No chest pain No chest pressure/discomfort No dyspnea No fatigue No lower extremity edema No nausea Yes numbness or tingling of extremity No orthopnea No palpitations No paroxysmal nocturnal dyspnea No polydipsia No polyuria No speech difficulty No syncope No visual disturbances   She is following a Healthy diet diet. Current exercise: walking  Wt Readings from Last 3 Encounters:  01/14/20 247 lb 3.2 oz (112.1 kg)  05/28/19 248 lb (112.5 kg)  05/01/18 224 lb (101.6 kg)   Last metabolic panel Lab Results  Component Value Date   GLUCOSE 87 05/28/2019   NA 143 05/28/2019   K 4.3 05/28/2019   BUN 13 05/28/2019   CREATININE 0.92 05/28/2019   GFRNONAA 70 05/28/2019   GFRAA 80 05/28/2019   CALCIUM 9.9 05/28/2019   AST 22 05/28/2019   ALT 30 05/28/2019   The 10-year ASCVD risk score Mikey Bussing DC Jr., et al., 2013) is: 10.9%  Patient here with c/o having cold and heat intolerance. She reports that her hair is falling out and that she is forgetful.   Patient Active Problem List   Diagnosis Date Noted  . History of colon polyps   . Polyp of sigmoid colon   . Benign neoplasm of descending colon   . Elevated rheumatoid factor 01/22/2017  . Hypercalcemia 01/22/2017  . Muscle cramps 01/22/2017  . Low back pain 10/04/2015  . Diabetes type 2, controlled ([place]) 07/05/2015  . Hematuria 03/29/2015  . Acute stress disorder 01/25/2015  . Benign paroxysmal positional nystagmus 01/25/2015  . Elevated CK 01/25/2015  . Big thyroid 01/25/2015  . BP (high blood pressure) 01/25/2015  . Low  serum cobalamin 01/25/2015  . Avitaminosis D 01/25/2015  . GERD (gastroesophageal reflux disease) 01/31/2014  . Family history of coronary artery disease 01/31/2014  . Shoulder pain 01/31/2014  . Abnormal EKG 01/31/2014  . Insomnia 03/17/2008  . Family history of colon cancer 09/16/2007  . Hypercholesteremia 01/05/2007   Past Medical History:  Diagnosis Date  . Arthritis    lower  back  . Benign paroxysmal positional vertigo   . Diabetes mellitus without complication ([place])   . Elevated CK   . GERD (gastroesophageal reflux disease)   . Hypercholesteremia   . Hyperglycemia   . Hypertension   . Insomnia   . Menopausal disorder        Medications: Outpatient Medications Prior to Visit  Medication Sig  . Cholecalciferol (VITAMIN D3) 5000 UNITS CAPS Take 1 capsule by mouth daily.  . cyanocobalamin 1000 MCG tablet Take 1,000 mcg by mouth daily.  Marland Kitchen ezetimibe (ZETIA) 10 MG tablet TAKE 1 TABLET BY MOUTH DAILY  . linaclotide (LINZESS) 145 MCG CAPS capsule Take 145 mcg by mouth daily before breakfast.  . lisinopril (ZESTRIL) 10 MG tablet TAKE 1 TABLET BY MOUTH DAILY.  . pantoprazole (PROTONIX) 40 MG tablet Take 1 tablet (40 mg total) by mouth daily.  . Vitamin D, Ergocalciferol, (DRISDOL) 1.25 MG (50000 UT) CAPS capsule TAKE 1 CAPSULE BY MOUTH EVERY 7 DAYS  . dicyclomine (BENTYL) 20 MG tablet Take 20 mg by mouth 4 (four) times daily -  before meals and at bedtime.  [name] Sodium 100 MG capsule Take 100 mg by mouth as needed for constipation.  Marland Kitchen esomeprazole (NEXIUM) 40 MG capsule TAKE 1 CAPSULE BY MOUTH DAILY BEFORE BREAKFAST.  . meloxicam (MOBIC) 15 MG tablet Take 1 tablet (15 mg total) by mouth as needed.  . methocarbamol (ROBAXIN) 500 MG tablet Take 500 mg by mouth 2 (two) times daily as needed for muscle spasms.  . Multiple Vitamin (MULTIVITAMIN PO) Take by mouth daily.  . Probiotic Product ([place]) CAPS Take by mouth.  . senna-docusate (STOOL SOFTENER/LAXATIVE) 8.6-50 MG tablet Take 1 tablet by mouth daily.  Marland Kitchen zolpidem (AMBIEN CR) 12.5 MG CR tablet Take 1 tablet (12.5 mg total) by mouth at bedtime as needed for sleep.   No facility-administered medications prior to visit.    Review of Systems  Constitutional: Negative.   Respiratory: Negative.   Cardiovascular: Negative.   Gastrointestinal: Positive for constipation.  Endocrine: Positive  for cold intolerance and heat intolerance.       Hair loss  Musculoskeletal: Negative.   Skin: Negative.   Neurological: Negative.   Hematological: Negative.     Last CBC Lab Results  Component Value Date   WBC 4.8 05/28/2019   HGB 12.0 05/28/2019   HCT 36.3 05/28/2019   MCV 76 (L) 05/28/2019   MCH 25.1 (L) 05/28/2019   RDW 14.3 05/28/2019   PLT 272 [id number]   Last metabolic panel Lab Results  Component Value Date   GLUCOSE 87 05/28/2019   NA 143 05/28/2019   K 4.3 05/28/2019   CL 105 05/28/2019   CO2 22 05/28/2019   BUN 13 05/28/2019   CREATININE 0.92 05/28/2019   GFRNONAA 70 05/28/2019   GFRAA 80 05/28/2019   CALCIUM 9.9 05/28/2019   PROT 7.0 05/28/2019   ALBUMIN 4.5 05/28/2019   LABGLOB 2.5 05/28/2019   AGRATIO 1.8 05/28/2019   BILITOT 0.3 05/28/2019   ALKPHOS 52 05/28/2019   AST 22 05/28/2019   ALT 30 05/28/2019  ANIONGAP 8 07/03/2014   Last lipids Lab Results  Component Value Date   CHOL 220 (H) 05/28/2019   HDL 50 05/28/2019   LDLCALC 147 (H) 05/28/2019   TRIG 127 05/28/2019   CHOLHDL 4.4 05/28/2019   Last hemoglobin A1c Lab Results  Component Value Date   HGBA1C 6.0 (H) 05/28/2019   Last thyroid functions Lab Results  Component Value Date   TSH 2.760 05/28/2019       Objective    BP 115/78 (BP Location: Left Arm, Patient Position: Sitting, Cuff Size: Large)   Pulse 85   Temp (!) 97.2 F (36.2 C) (Temporal)   Resp 16   Ht 6' (1.829 m)   Wt 247 lb 3.2 oz (112.1 kg)   BMI 33.53 kg/m     Physical Exam Vitals reviewed.  Constitutional:      General: She is not in acute distress.    Appearance: Normal appearance. She is well-developed. She is obese. She is not ill-appearing or diaphoretic.  HENT:     Head: Normocephalic and atraumatic.     Right Ear: Hearing, tympanic membrane, ear canal and external ear normal.     Left Ear: Hearing, tympanic membrane, ear canal and external ear normal.     Mouth/Throat:     Pharynx: Uvula  midline.  Eyes:     General: No scleral icterus.       Right eye: No discharge.        Left eye: No discharge.     Extraocular Movements: Extraocular movements intact.     Conjunctiva/sclera: Conjunctivae normal.     Pupils: Pupils are equal, round, and reactive to light.  Neck:     Thyroid: Thyromegaly present.     Vascular: No carotid bruit or JVD.     Trachea: No tracheal deviation.  Cardiovascular:     Rate and Rhythm: Normal rate and regular rhythm.     Heart sounds: Normal heart sounds. No murmur. No friction rub. No gallop.   Pulmonary:     Effort: Pulmonary effort is normal. No respiratory distress.     Breath sounds: Normal breath sounds. No stridor. No wheezing or rales.  Musculoskeletal:     Cervical back: Normal range of motion and neck supple.  Lymphadenopathy:     Cervical: No cervical adenopathy.  Skin:    General: Skin is warm and dry.  Neurological:     Mental Status: She is alert.       No results found for any visits on 01/14/20.  Assessment & Plan    1. Essential hypertension Stable. Continue Lisinopril 10mg . Will check labs as below and f/u pending results. - Comprehensive Metabolic Panel (CMET) - CBC  2. Controlled type 2 diabetes mellitus without complication, without long-term current use of insulin (H[facility]) Diet controlled. Will check labs as below and f/u pending results. - Hemoglobin A1c - Comprehensive Metabolic Panel (CMET) - CBC  3. Family history of coronary artery disease Will check labs as below and f/u pending results. - Lipid panel  4. Hypercholesteremia Patient has tried multiple statins and now on Zetia. However, recently stopped Zetia when she started feeling "off" and having paresthesias. Will check labs as below and f/u pending results. - Lipid panel - Comprehensive Metabolic Panel (CMET) - CBC  5. Paresthesia Unknown cause. Possibly from Zetia. Will check labs as below and f/u pending results. - CBC - B12 and Folate  Panel - Fe+TIBC+Fer  6. Cold intolerance Has known slightly bigger thyroid without  nodules. Will check labs as below and f/u pending results. - CBC - B12 and Folate Panel - Fe+TIBC+Fer - TSH + free T4  7. Elevated CK H/O this. Will check labs as below and f/u pending results. - CK (Creatine Kinase)  8. Big thyroid Will check labs as below and f/u pending results. - TSH + free T4  9. Chronic idiopathic constipation Was put on Linzess 153mcg by Dr. Allen Norris. Not working well. Will increase to 211mcg. Samples given. If they are more effective she will have Dr. Allen Norris increase dose on refills.   No follow-ups on file.      Reynolds Bowl, PA-C, have reviewed all documentation for this visit. The documentation on 01/14/20 for the exam, diagnosis, procedures, and orders are all accurate and complete.   Rubye Beach  [facility] [phone] (phone) [phone] (fax)  [place]

## 2020-01-14 ENCOUNTER — Other Ambulatory Visit: Payer: Self-pay

## 2020-01-14 ENCOUNTER — Encounter: Payer: Self-pay | Admitting: Physician Assistant

## 2020-01-14 ENCOUNTER — Ambulatory Visit: Payer: 59 | Admitting: Physician Assistant

## 2020-01-14 VITALS — BP 115/78 | HR 85 | Temp 97.2°F | Resp 16 | Ht 72.0 in | Wt 247.2 lb

## 2020-01-14 DIAGNOSIS — I10 Essential (primary) hypertension: Secondary | ICD-10-CM | POA: Diagnosis not present

## 2020-01-14 DIAGNOSIS — E010 Iodine-deficiency related diffuse (endemic) goiter: Secondary | ICD-10-CM | POA: Diagnosis not present

## 2020-01-14 DIAGNOSIS — Z8249 Family history of ischemic heart disease and other diseases of the circulatory system: Secondary | ICD-10-CM

## 2020-01-14 DIAGNOSIS — E7800 Pure hypercholesterolemia, unspecified: Secondary | ICD-10-CM

## 2020-01-14 DIAGNOSIS — R202 Paresthesia of skin: Secondary | ICD-10-CM | POA: Diagnosis not present

## 2020-01-14 DIAGNOSIS — R6889 Other general symptoms and signs: Secondary | ICD-10-CM

## 2020-01-14 DIAGNOSIS — K5904 Chronic idiopathic constipation: Secondary | ICD-10-CM | POA: Diagnosis not present

## 2020-01-14 DIAGNOSIS — R748 Abnormal levels of other serum enzymes: Secondary | ICD-10-CM | POA: Diagnosis not present

## 2020-01-14 DIAGNOSIS — E119 Type 2 diabetes mellitus without complications: Secondary | ICD-10-CM

## 2020-01-14 NOTE — Patient Instructions (Signed)
Paresthesia Paresthesia is a burning or prickling feeling. This feeling can happen in any part of the body. It often happens in the hands, arms, legs, or feet. Usually, it is not painful. In most cases, the feeling goes away in a short time and is not a sign of a serious problem. If you have paresthesia that lasts a long time, you may need to be seen by your doctor. Follow these instructions at home: Alcohol use   Do not drink alcohol if: ? Your doctor tells you not to drink. ? You are pregnant, may be pregnant, or are planning to become pregnant.  If you drink alcohol: ? Limit how much you use to:  0-1 drink a day for women.  0-2 drinks a day for men. ? Be aware of how much alcohol is in your drink. In the [place] one drink equals one 12 oz bottle of beer (355 mL), one 5 oz glass of wine (148 mL), or one 1 oz glass of hard liquor (44 mL). Nutrition   Eat a healthy diet. This includes: ? Eating foods that have a lot of fiber in them, such as fresh fruits and vegetables, whole grains, and beans. ? Limiting foods that have a lot of fat and processed sugars in them, such as fried or sweet foods. General instructions  Take over-the-counter and prescription medicines only as told by your doctor.  Do not use any products that have nicotine or tobacco in them, such as cigarettes and e-cigarettes. If you need help quitting, ask your doctor.  If you have diabetes, work with your doctor to make sure your blood sugar stays in a healthy range.  If your feet feel numb: ? Check for redness, warmth, and swelling every day. ? Wear padded socks and comfortable shoes. These help protect your feet.  Keep all follow-up visits as told by your doctor. This is important. Contact a doctor if:  You have paresthesia that gets worse or does not go away.  Your burning or prickling feeling gets worse when you walk.  You have pain or cramps.  You feel dizzy.  You have a rash. Get help right away if  you:  Feel weak.  Have trouble walking or moving.  Have problems speaking, understanding, or seeing.  Feel confused.  Cannot control when you pee (urinate) or poop (have a bowel movement).  Lose feeling (have numbness) after an injury.  Have new weakness in an arm or leg.  Pass out (faint). Summary  Paresthesia is a burning or prickling feeling. It often happens in the hands, arms, legs, or feet.  In most cases, the feeling goes away in a short time and is not a sign of a serious problem.  If you have paresthesia that lasts a long time, you may need to be seen by your doctor. This information is not intended to replace advice given to you by your health care provider. Make sure you discuss any questions you have with your health care provider. Document Revised: 09/28/2018 Document Reviewed: 09/11/2017 Elsevier Patient Education  2020 [employer]  

## 2020-01-15 LAB — COMPREHENSIVE METABOLIC PANEL
ALT: 35 IU/L — ABNORMAL HIGH (ref 0–32)
AST: 26 IU/L (ref 0–40)
Albumin/Globulin Ratio: 1.6 (ref 1.2–2.2)
Albumin: 4.9 g/dL (ref 3.8–4.9)
Alkaline Phosphatase: 54 IU/L (ref 39–117)
BUN/Creatinine Ratio: 12 (ref 9–23)
BUN: 13 mg/dL (ref 6–24)
Bilirubin Total: 0.3 mg/dL (ref 0.0–1.2)
CO2: 22 mmol/L (ref 20–29)
Calcium: 10.2 mg/dL (ref 8.7–10.2)
Chloride: 103 mmol/L (ref 96–106)
Creatinine, Ser: 1.08 mg/dL — ABNORMAL HIGH (ref 0.57–1.00)
GFR calc Af Amer: 66 mL/min/{1.73_m2} (ref >59)
GFR calc non Af Amer: 57 mL/min/{1.73_m2} — ABNORMAL LOW (ref >59)
Globulin, Total: 3 g/dL (ref 1.5–4.5)
Glucose: 105 mg/dL — ABNORMAL HIGH (ref 65–99)
Potassium: 4.5 mmol/L (ref 3.5–5.2)
Sodium: 141 mmol/L (ref 134–144)
Total Protein: 7.9 g/dL (ref 6.0–8.5)

## 2020-01-15 LAB — LIPID PANEL
Chol/HDL Ratio: 4.4 ratio (ref 0.0–4.4)
Cholesterol, Total: 235 mg/dL — ABNORMAL HIGH (ref 100–199)
HDL: 53 mg/dL (ref >39)
LDL Chol Calc (NIH): 159 mg/dL — ABNORMAL HIGH (ref 0–99)
Triglycerides: 129 mg/dL (ref 0–149)
VLDL Cholesterol Cal: 23 mg/dL (ref 5–40)

## 2020-01-15 LAB — CBC
Hematocrit: 39.4 % (ref 34.0–46.6)
Hemoglobin: 12.5 g/dL (ref 11.1–15.9)
MCH: 25.5 pg — ABNORMAL LOW (ref 26.6–33.0)
MCHC: 31.7 g/dL (ref 31.5–35.7)
MCV: 80 fL (ref 79–97)
Platelets: 260 10*3/uL (ref 150–450)
RBC: 4.91 x10E6/uL (ref 3.77–5.28)
RDW: 14.2 % (ref 11.7–15.4)
WBC: 4.6 10*3/uL (ref 3.4–10.8)

## 2020-01-15 LAB — HEMOGLOBIN A1C
Est. average glucose Bld gHb Est-mCnc: 131 mg/dL
Hgb A1c MFr Bld: 6.2 % — ABNORMAL HIGH (ref 4.8–5.6)

## 2020-01-15 LAB — TSH+FREE T4
Free T4: 1.18 ng/dL (ref 0.82–1.77)
TSH: 2.81 u[IU]/mL (ref 0.450–4.500)

## 2020-01-15 LAB — IRON,TIBC AND FERRITIN PANEL
Ferritin: 368 ng/mL — ABNORMAL HIGH (ref 15–150)
Iron Saturation: 18 % (ref 15–55)
Iron: 65 ug/dL (ref 27–159)
Total Iron Binding Capacity: 354 ug/dL (ref 250–450)
UIBC: 289 ug/dL (ref 131–425)

## 2020-01-15 LAB — CK: Total CK: 561 U/L (ref 32–182)

## 2020-01-15 LAB — B12 AND FOLATE PANEL
Folate: 8 ng/mL (ref >3.0)
Vitamin B-12: 1100 pg/mL (ref 232–1245)

## 2020-01-17 ENCOUNTER — Telehealth: Payer: Self-pay

## 2020-01-17 DIAGNOSIS — R748 Abnormal levels of other serum enzymes: Secondary | ICD-10-CM

## 2020-01-17 NOTE — Telephone Encounter (Signed)
-----   Message from Mar Daring, [place] sent at 01/17/2020 12:54 PM EDT ----- A1c is up slightly to 6.2 from 6.0. Cholesterol also up just slightly. HDL , good, cholesterol also increased some and helps offer some cardioprotection. Kidney function is pretty stable and slightly improved compared to last check. Liver enzymes stable. Glucose up slightly at 105. Sodium, potassium and calcium are normal. Blood count is stable. B12 and folate are normal. Iron is normal. CK is up. Do not restart Zetia as we had discussed. Thyroid is normal.

## 2020-01-17 NOTE — Telephone Encounter (Signed)
I want to wait and let her CK come down some before adding anything back that could make it worse again.  We can recheck her CK in 6-8 weeks.

## 2020-01-17 NOTE — Telephone Encounter (Signed)
Patient advised as directed below. Patient is asking if provider going to prescribed something for her Cholesterol.

## 2020-01-21 ENCOUNTER — Other Ambulatory Visit: Payer: Self-pay | Admitting: Physician Assistant

## 2020-01-21 DIAGNOSIS — I10 Essential (primary) hypertension: Secondary | ICD-10-CM

## 2020-03-01 ENCOUNTER — Other Ambulatory Visit: Payer: Self-pay | Admitting: Physician Assistant

## 2020-03-01 MED ORDER — VITAMIN D (ERGOCALCIFEROL) 1.25 MG (50000 UNIT) PO CAPS: 50000.0000 [IU] | ORAL_CAPSULE | ORAL | 1 refills | Status: DC

## 2020-03-01 NOTE — Telephone Encounter (Signed)
[facility] faxed refill request for the following medications:  Vitamin D, Ergocalciferol, (DRISDOL) 1.25 MG (50000 UT) CAPS capsule   Last Rx: 09/15/2019 1 refill LOV: 01/14/2020 Please advise. Thanks TNP

## 2020-03-07 DIAGNOSIS — R748 Abnormal levels of other serum enzymes: Secondary | ICD-10-CM | POA: Diagnosis not present

## 2020-03-08 LAB — CK: Total CK: 365 U/L — ABNORMAL HIGH (ref 32–182)

## 2020-03-10 ENCOUNTER — Encounter: Payer: Self-pay | Admitting: Physician Assistant

## 2020-03-14 ENCOUNTER — Encounter: Payer: Self-pay | Admitting: Physician Assistant

## 2020-03-14 DIAGNOSIS — E782 Mixed hyperlipidemia: Secondary | ICD-10-CM

## 2020-03-15 ENCOUNTER — Other Ambulatory Visit: Payer: Self-pay | Admitting: Physician Assistant

## 2020-03-15 MED ORDER — FENOFIBRATE 145 MG PO TABS: 145.0000 mg | ORAL_TABLET | Freq: Every day | ORAL | 1 refills | Status: DC

## 2020-05-29 ENCOUNTER — Encounter: Payer: 59 | Admitting: Physician Assistant

## 2020-05-30 ENCOUNTER — Other Ambulatory Visit: Payer: Self-pay | Admitting: Gastroenterology

## 2020-05-30 MED ORDER — PANTOPRAZOLE SODIUM 40 MG PO TBEC: 40.0000 mg | DELAYED_RELEASE_TABLET | Freq: Every day | ORAL | 4 refills | Status: DC

## 2020-06-19 ENCOUNTER — Other Ambulatory Visit: Payer: Self-pay | Admitting: Gastroenterology

## 2020-06-21 ENCOUNTER — Encounter: Payer: Self-pay | Admitting: Physician Assistant

## 2020-06-21 DIAGNOSIS — Z1239 Encounter for other screening for malignant neoplasm of breast: Secondary | ICD-10-CM

## 2020-06-26 ENCOUNTER — Other Ambulatory Visit: Payer: Self-pay | Admitting: Gastroenterology

## 2020-08-02 NOTE — Progress Notes (Signed)
Complete physical exam   Patient: Alexandra Mora   DOB: 03-25-1961   59 y.o. Female  MRN: [id number] Visit Date: 08/04/2020  Today's healthcare provider: Mar Daring, PA-C   Chief Complaint  Patient presents with  . Annual Exam   Subjective    Alexandra Mora is a 59 y.o. female who presents today for a complete physical exam.  She reports consuming a general diet. The patient does not participate in regular exercise at present. She generally feels well. She reports sleeping poorly. She does have additional problems to discuss today.  HPI  05/02/2017-Vaginal pap was normal and HPV negative. 08/28/2020-Mammogram scheduled. 06/13/17-Colonoscopy done. Repeat in 5 years  Having palpitations. Feels secondary to not sleeping well. Has been using Zolpidem CR 12.5mg  but reports only sleeping 1-2 hours then she is wide awake. Has tried trazodone, melatonin, Unisom, Tylenol PM,  Lunesta, Belsomra as well without improvement.   Also constipation not improved. Linzess quit working as well. Reports she is back to using colace daily.  Past Medical History:  Diagnosis Date  . Arthritis    lower back  . Benign paroxysmal positional vertigo   . Diabetes mellitus without complication ([place])   . Elevated CK   . GERD (gastroesophageal reflux disease)   . Hypercholesteremia   . Hyperglycemia   . Hypertension   . Insomnia   . Menopausal disorder    Past Surgical History:  Procedure Laterality Date  . ABDOMINAL HYSTERECTOMY    . COLONOSCOPY WITH PROPOFOL N/A 06/13/2017   Procedure: COLONOSCOPY WITH PROPOFOL;  Surgeon: Lucilla Lame, MD;  Location: [facility];  Service: Gastroenterology;  Laterality: N/A;  . LAPAROSCOPIC CHOLECYSTECTOMY    . POLYPECTOMY  06/13/2017   Procedure: POLYPECTOMY;  Surgeon: Lucilla Lame, MD;  Location: [facility];  Service: Gastroenterology;;  . TOTAL ABDOMINAL HYSTERECTOMY W/ BILATERAL SALPINGOOPHORECTOMY     Social History   Socioeconomic  History  . Marital status: Single    Spouse name: Not on file  . Number of children: 1  . Years of education: Not on file  . Highest education level: Not on file  Occupational History  . Not on file  Tobacco Use  . Smoking status: Never Smoker  . Smokeless tobacco: Never Used  Substance and Sexual Activity  . Alcohol use: No  . Drug use: No  . Sexual activity: Never  Other Topics Concern  . Not on file  Social History Narrative  . Not on file   Social Determinants of Health   Financial Resource Strain:   . Difficulty of Paying Living Expenses: Not on file  Food Insecurity:   . Worried About Charity fundraiser in the Last Year: Not on file  . Ran Out of Food in the Last Year: Not on file  Transportation Needs:   . Lack of Transportation (Medical): Not on file  . Lack of Transportation (Non-Medical): Not on file  Physical Activity:   . Days of Exercise per Week: Not on file  . Minutes of Exercise per Session: Not on file  Stress:   . Feeling of Stress : Not on file  Social Connections:   . Frequency of Communication with Friends and Family: Not on file  . Frequency of Social Gatherings with Friends and Family: Not on file  . Attends Religious Services: Not on file  . Active Member of Clubs or Organizations: Not on file  . Attends Archivist Meetings: Not on file  .  Marital Status: Not on file  Intimate Partner Violence:   . Fear of Current or Ex-Partner: Not on file  . Emotionally Abused: Not on file  . Physically Abused: Not on file  . Sexually Abused: Not on file   Family Status  Relation Name Status  . Mother  Alive       stroke  . Father  Alive       diabetes  . Sister  Alive  . Brother  Alive  . Sister  Alive  . Sister  Alive  . Other  (Not Specified)  . Mat Aunt  (Not Specified)  . Mat Aunt  (Not Specified)   Family History  Problem Relation Age of Onset  . Heart disease Mother   . Heart attack Mother 60  . Hyperlipidemia Mother   .  Hypertension Mother   . Stroke Mother   . Hypertension Father   . Hypertension Sister   . Hypertension Brother   . Hypertension Sister   . Hypertension Sister   . Colon cancer Other   . Diabetes Other   . Breast cancer Maternal Aunt   . Breast cancer Maternal Aunt    Allergies  Allergen Reactions  . Dexilant [Dexlansoprazole]     Severe diarrhea  . Statins Other (See Comments)    Increased CK levels  . Zetia [Ezetimibe] Other (See Comments)    Increased CK level and muscle cramps    Patient Care Team: Rubye Beach as PCP - General (Family Medicine)   Medications: Outpatient Medications Prior to Visit  Medication Sig  . Cholecalciferol (VITAMIN D3) 5000 UNITS CAPS Take 1 capsule by mouth daily.   . cyanocobalamin 1000 MCG tablet Take 1,000 mcg by mouth daily.  [name] Sodium (COLACE PO) Take by mouth daily.  . fenofibrate (TRICOR) 145 MG tablet Take 1 tablet (145 mg total) by mouth daily.  Marland Kitchen lisinopril (ZESTRIL) 10 MG tablet TAKE 1 TABLET BY MOUTH DAILY.  . pantoprazole (PROTONIX) 40 MG tablet TAKE 1 TABLET BY MOUTH DAILY.  Marland Kitchen Vitamin D, Ergocalciferol, (DRISDOL) 1.25 MG (50000 UNIT) CAPS capsule Take 1 capsule (50,000 Units total) by mouth every 7 (seven) days.  Marland Kitchen linaclotide (LINZESS) 145 MCG CAPS capsule Take 145 mcg by mouth daily before breakfast.   No facility-administered medications prior to visit.    Review of Systems  Constitutional: Negative.   HENT: Negative.   Eyes: Negative.   Respiratory: Negative.   Cardiovascular: Positive for palpitations.  Gastrointestinal: Positive for constipation.  Endocrine: Positive for cold intolerance.  Genitourinary: Negative.   Musculoskeletal: Negative.   Skin: Negative.   Allergic/Immunologic: Negative.   Neurological: Negative.   Hematological: Negative.   Psychiatric/Behavioral: Positive for sleep disturbance.    Last CBC Lab Results  Component Value Date   WBC 4.6 01/14/2020   HGB 12.5  01/14/2020   HCT 39.4 01/14/2020   MCV 80 01/14/2020   MCH 25.5 (L) 01/14/2020   RDW 14.2 01/14/2020   PLT 260 29/93/7169   Last metabolic panel Lab Results  Component Value Date   GLUCOSE 105 (H) 01/14/2020   NA 141 01/14/2020   K 4.5 01/14/2020   CL 103 01/14/2020   CO2 22 01/14/2020   BUN 13 01/14/2020   CREATININE 1.08 (H) 01/14/2020   GFRNONAA 57 (L) 01/14/2020   GFRAA 66 01/14/2020   CALCIUM 10.2 01/14/2020   PROT 7.9 01/14/2020   ALBUMIN 4.9 01/14/2020   LABGLOB 3.0 01/14/2020   AGRATIO 1.6 01/14/2020  BILITOT 0.3 01/14/2020   ALKPHOS 54 01/14/2020   AST 26 01/14/2020   ALT 35 (H) 01/14/2020   ANIONGAP 8 07/03/2014      Objective    BP 119/81 (BP Location: Left Arm, Patient Position: Sitting, Cuff Size: Large)   Pulse 79   Temp 98.7 F (37.1 C) (Oral)   Resp 16   Ht 6' (1.829 m)   Wt 250 lb 3.2 oz (113.5 kg)   BMI 33.93 kg/m  BP Readings from Last 3 Encounters:  08/04/20 119/81  01/14/20 115/78  05/28/19 128/87   Wt Readings from Last 3 Encounters:  08/04/20 250 lb 3.2 oz (113.5 kg)  01/14/20 247 lb 3.2 oz (112.1 kg)  05/28/19 248 lb (112.5 kg)      Physical Exam Vitals reviewed.  Constitutional:      General: She is not in acute distress.    Appearance: Normal appearance. She is well-developed. She is obese. She is not ill-appearing or diaphoretic.  HENT:     Head: Normocephalic and atraumatic.     Right Ear: Tympanic membrane, ear canal and external ear normal.     Left Ear: Tympanic membrane, ear canal and external ear normal.  Eyes:     General: No scleral icterus.       Right eye: No discharge.        Left eye: No discharge.     Extraocular Movements: Extraocular movements intact.     Conjunctiva/sclera: Conjunctivae normal.     Pupils: Pupils are equal, round, and reactive to light.  Neck:     Thyroid: No thyromegaly.     Vascular: No carotid bruit or JVD.     Trachea: No tracheal deviation.  Cardiovascular:     Rate and Rhythm:  Normal rate and regular rhythm.     Pulses: Normal pulses.     Heart sounds: Normal heart sounds. No murmur heard.  No friction rub. No gallop.   Pulmonary:     Effort: Pulmonary effort is normal. No respiratory distress.     Breath sounds: Normal breath sounds. No wheezing or rales.  Chest:     Chest wall: No tenderness.  Abdominal:     General: Abdomen is flat. Bowel sounds are normal. There is no distension.     Palpations: Abdomen is soft. There is no mass.     Tenderness: There is no abdominal tenderness. There is no guarding or rebound.  Musculoskeletal:        General: No tenderness. Normal range of motion.     Cervical back: Normal range of motion and neck supple. No tenderness.     Right lower leg: No edema.     Left lower leg: No edema.  Lymphadenopathy:     Cervical: No cervical adenopathy.  Skin:    General: Skin is warm and dry.     Capillary Refill: Capillary refill takes less than 2 seconds.     Findings: No rash.  Neurological:     General: No focal deficit present.     Mental Status: She is alert and oriented to person, place, and time. Mental status is at baseline.  Psychiatric:        Mood and Affect: Mood normal.        Behavior: Behavior normal.        Thought Content: Thought content normal.        Judgment: Judgment normal.     Diabetic Foot Exam - Simple   No data filed  Last depression screening scores PHQ 2/9 Scores 08/04/2020 05/28/2019 05/01/2018  PHQ - 2 Score 0 0 0  PHQ- 9 Score - 3 0   Last fall risk screening Fall Risk  01/14/2020  Falls in the past year? 0  Number falls in past yr: 0  Injury with Fall? 0  Follow up Falls evaluation completed   Last Audit-C alcohol use screening Alcohol Use Disorder Test (AUDIT) 08/04/2020  1. How often do you have a drink containing alcohol? 0  2. How many drinks containing alcohol do you have on a typical day when you are drinking? 0  3. How often do you have six or more drinks on one occasion?  0  AUDIT-C Score 0  Alcohol Brief Interventions/Follow-up AUDIT Score <7 follow-up not indicated   A score of 3 or more in women, and 4 or more in men indicates increased risk for alcohol abuse, EXCEPT if all of the points are from question 1   No results found for any visits on 08/04/20.  Assessment & Plan    Routine Health Maintenance and Physical Exam  Exercise Activities and Dietary recommendations Goals    . Exercise 150 minutes per week (moderate activity)       Immunization History  Administered Date(s) Administered  . Influenza,inj,Quad PF,6+ Mos 06/17/2015, 05/28/2019  . Pneumococcal Polysaccharide-23 05/28/2019  . Tdap 01/19/2014    Health Maintenance  Topic Date Due  . OPHTHALMOLOGY EXAM  Never done  . COVID-19 Vaccine (1) Never done  . HIV Screening  Never done  . INFLUENZA VACCINE  04/16/2020  . PAP SMEAR-Modifier  04/30/2020  . FOOT EXAM  05/27/2020  . HEMOGLOBIN A1C  07/15/2020  . MAMMOGRAM  08/25/2021  . COLONOSCOPY  06/13/2022  . TETANUS/TDAP  01/20/2024  . PNEUMOCOCCAL POLYSACCHARIDE VACCINE AGE 28-64 HIGH RISK  Completed  . Hepatitis C Screening  Completed    Discussed health benefits of physical activity, and encouraged her to engage in regular exercise appropriate for her age and condition.  1. Annual physical exam Normal physical exam today. Will check labs as below and f/u pending lab results. If labs are stable and WNL she will not need to have these rechecked for one year at her next annual physical exam. She is to call the office in the meantime if she has any acute issue, questions or concerns.  2. Palpitations EKG today shows NSR rate of 62, no ST changes, personally reviewed by me.  - EKG 12-Lead  3. Essential hypertension Stable. Diagnosis pulled for medication refill. Continue current medical treatment plan. Will check labs as below and f/u pending results. - lisinopril (ZESTRIL) 10 MG tablet; Take 1 tablet (10 mg total) by mouth daily.   Dispense: 90 tablet; Refill: 3 - CBC with Differential/Platelet - Comprehensive metabolic panel - Hemoglobin A1c  4. Hypercholesterolemia with hypertriglyceridemia Doing well. No myalgias. Will check labs as below and f/u pending results. - fenofibrate (TRICOR) 145 MG tablet; Take 1 tablet (145 mg total) by mouth daily.  Dispense: 90 tablet; Refill: 3 - CBC with Differential/Platelet - Comprehensive metabolic panel - Hemoglobin A1c - Lipid panel  5. Primary insomnia Worsening. Multiple failures noted in HPI. Will try Sonata as below. F/U in 4 weeks via mychart if effective.  - zaleplon (SONATA) 10 MG capsule; Take 1 capsule (10 mg total) by mouth at bedtime as needed for sleep.  Dispense: 30 capsule; Refill: 0  6. Controlled type 2 diabetes mellitus without complication, without long-term current use of  insulin ([place]) Diet controlled. Will check labs as below and f/u pending results. - CBC with Differential/Platelet - Comprehensive metabolic panel - Hemoglobin A1c  7. Big thyroid Stable. Asymptomatic. Will check labs as below and f/u pending results. - TSH  8. Avitaminosis D H/O this and postmenopausal. Will check labs as below and f/u pending results. Continue high dose. - Vitamin D, Ergocalciferol, (DRISDOL) 1.25 MG (50000 UNIT) CAPS capsule; Take 1 capsule (50,000 Units total) by mouth every 7 (seven) days.  Dispense: 12 capsule; Refill: 3  9. Elevated CK H/O this with Statins. On Tricor and doing well. No myalgias. WIll recheck CK as below.  - CK (Creatine Kinase)   No follow-ups on file.     Reynolds Bowl, PA-C, have reviewed all documentation for this visit. The documentation on 08/04/20 for the exam, diagnosis, procedures, and orders are all accurate and complete.   Rubye Beach  [facility] [phone] (phone) [phone] (fax)  [place]

## 2020-08-04 ENCOUNTER — Other Ambulatory Visit: Payer: Self-pay

## 2020-08-04 ENCOUNTER — Encounter: Payer: Self-pay | Admitting: Physician Assistant

## 2020-08-04 ENCOUNTER — Ambulatory Visit (INDEPENDENT_AMBULATORY_CARE_PROVIDER_SITE_OTHER): Payer: 59 | Admitting: Physician Assistant

## 2020-08-04 ENCOUNTER — Other Ambulatory Visit: Payer: Self-pay | Admitting: Physician Assistant

## 2020-08-04 VITALS — BP 119/81 | HR 79 | Temp 98.7°F | Resp 16 | Ht 72.0 in | Wt 250.2 lb

## 2020-08-04 DIAGNOSIS — E010 Iodine-deficiency related diffuse (endemic) goiter: Secondary | ICD-10-CM | POA: Diagnosis not present

## 2020-08-04 DIAGNOSIS — E782 Mixed hyperlipidemia: Secondary | ICD-10-CM | POA: Diagnosis not present

## 2020-08-04 DIAGNOSIS — R748 Abnormal levels of other serum enzymes: Secondary | ICD-10-CM

## 2020-08-04 DIAGNOSIS — F5101 Primary insomnia: Secondary | ICD-10-CM

## 2020-08-04 DIAGNOSIS — E559 Vitamin D deficiency, unspecified: Secondary | ICD-10-CM | POA: Diagnosis not present

## 2020-08-04 DIAGNOSIS — E119 Type 2 diabetes mellitus without complications: Secondary | ICD-10-CM | POA: Diagnosis not present

## 2020-08-04 DIAGNOSIS — I10 Essential (primary) hypertension: Secondary | ICD-10-CM | POA: Diagnosis not present

## 2020-08-04 DIAGNOSIS — R002 Palpitations: Secondary | ICD-10-CM

## 2020-08-04 DIAGNOSIS — Z0000 Encounter for general adult medical examination without abnormal findings: Secondary | ICD-10-CM | POA: Diagnosis not present

## 2020-08-04 MED ORDER — LISINOPRIL 10 MG PO TABS: 10.0000 mg | ORAL_TABLET | Freq: Every day | ORAL | 3 refills | Status: DC

## 2020-08-04 MED ORDER — ZALEPLON 10 MG PO CAPS: 10.0000 mg | ORAL_CAPSULE | Freq: Every evening | ORAL | 0 refills | Status: DC | PRN

## 2020-08-04 MED ORDER — VITAMIN D (ERGOCALCIFEROL) 1.25 MG (50000 UNIT) PO CAPS: 50000.0000 [IU] | ORAL_CAPSULE | ORAL | 3 refills | Status: DC

## 2020-08-04 MED ORDER — FENOFIBRATE 145 MG PO TABS: 145.0000 mg | ORAL_TABLET | Freq: Every day | ORAL | 3 refills | Status: DC

## 2020-08-04 NOTE — Patient Instructions (Signed)
Preventive Care 40-58 Years Old, Female Preventive care refers to visits with your health care provider and lifestyle choices that can promote health and wellness. This includes:  A yearly physical exam. This may also be called an annual well check.  Regular dental visits and eye exams.  Immunizations.  Screening for certain conditions.  Healthy lifestyle choices, such as eating a healthy diet, getting regular exercise, not using drugs or products that contain nicotine and tobacco, and limiting alcohol use. What can I expect for my preventive care visit? Physical exam Your health care provider will check your:  Height and weight. This may be used to calculate body mass index (BMI), which tells if you are at a healthy weight.  Heart rate and blood pressure.  Skin for abnormal spots. Counseling Your health care provider may ask you questions about your:  Alcohol, tobacco, and drug use.  Emotional well-being.  Home and relationship well-being.  Sexual activity.  Eating habits.  Work and work environment.  Method of birth control.  Menstrual cycle.  Pregnancy history. What immunizations do I need?  Influenza (flu) vaccine  This is recommended every year. Tetanus, diphtheria, and pertussis (Tdap) vaccine  You may need a Td booster every 10 years. Varicella (chickenpox) vaccine  You may need this if you have not been vaccinated. Zoster (shingles) vaccine  You may need this after age 60. Measles, mumps, and rubella (MMR) vaccine  You may need at least one dose of MMR if you were born in 1957 or later. You may also need a second dose. Pneumococcal conjugate (PCV13) vaccine  You may need this if you have certain conditions and were not previously vaccinated. Pneumococcal polysaccharide (PPSV23) vaccine  You may need one or two doses if you smoke cigarettes or if you have certain conditions. Meningococcal conjugate (MenACWY) vaccine  You may need this if you  have certain conditions. Hepatitis A vaccine  You may need this if you have certain conditions or if you travel or work in places where you may be exposed to hepatitis A. Hepatitis B vaccine  You may need this if you have certain conditions or if you travel or work in places where you may be exposed to hepatitis B. Haemophilus influenzae type b (Hib) vaccine  You may need this if you have certain conditions. Human papillomavirus (HPV) vaccine  If recommended by your health care provider, you may need three doses over 6 months. You may receive vaccines as individual doses or as more than one vaccine together in one shot (combination vaccines). Talk with your health care provider about the risks and benefits of combination vaccines. What tests do I need? Blood tests  Lipid and cholesterol levels. These may be checked every 5 years, or more frequently if you are over 50 years old.  Hepatitis C test.  Hepatitis B test. Screening  Lung cancer screening. You may have this screening every year starting at age 55 if you have a 30-pack-year history of smoking and currently smoke or have quit within the past 15 years.  Colorectal cancer screening. All adults should have this screening starting at age 50 and continuing until age 75. Your health care provider may recommend screening at age 45 if you are at increased risk. You will have tests every 1-10 years, depending on your results and the type of screening test.  Diabetes screening. This is done by checking your blood sugar (glucose) after you have not eaten for a while (fasting). You may have this   done every 1-3 years.  Mammogram. This may be done every 1-2 years. Talk with your health care provider about when you should start having regular mammograms. This may depend on whether you have a family history of breast cancer.  BRCA-related cancer screening. This may be done if you have a family history of breast, ovarian, tubal, or peritoneal  cancers.  Pelvic exam and Pap test. This may be done every 3 years starting at age 24. Starting at age 53, this may be done every 5 years if you have a Pap test in combination with an HPV test. Other tests  Sexually transmitted disease (STD) testing.  Bone density scan. This is done to screen for osteoporosis. You may have this scan if you are at high risk for osteoporosis. Follow these instructions at home: Eating and drinking  Eat a diet that includes fresh fruits and vegetables, whole grains, lean protein, and low-fat dairy.  Take vitamin and mineral supplements as recommended by your health care provider.  Do not drink alcohol if: ? Your health care provider tells you not to drink. ? You are pregnant, may be pregnant, or are planning to become pregnant.  If you drink alcohol: ? Limit how much you have to 0-1 drink a day. ? Be aware of how much alcohol is in your drink. In the [place] one drink equals one 12 oz bottle of beer (355 mL), one 5 oz glass of wine (148 mL), or one 1 oz glass of hard liquor (44 mL). Lifestyle  Take daily care of your teeth and gums.  Stay active. Exercise for at least 30 minutes on 5 or more days each week.  Do not use any products that contain nicotine or tobacco, such as cigarettes, e-cigarettes, and chewing tobacco. If you need help quitting, ask your health care provider.  If you are sexually active, practice safe sex. Use a condom or other form of birth control (contraception) in order to prevent pregnancy and STIs (sexually transmitted infections).  If told by your health care provider, take low-dose aspirin daily starting at age 86. What's next?  Visit your health care provider once a year for a well check visit.  Ask your health care provider how often you should have your eyes and teeth checked.  Stay up to date on all vaccines. This information is not intended to replace advice given to you by your health care provider. Make sure you  discuss any questions you have with your health care provider. Document Revised: 05/14/2018 Document Reviewed: 05/14/2018 Elsevier Patient Education  [place].   Zaleplon capsules What is this medicine? ZALEPLON (ZAL e plon) is used to treat insomnia. This medicine helps you to fall asleep. This medicine may be used for other purposes; ask your health care provider or pharmacist if you have questions. COMMON BRAND NAME(S): Sonata What should I tell my health care provider before I take this medicine? They need to know if you have any of these conditions:  depression  history of a drug or alcohol abuse problem  liver disease  lung or breathing disease  sleep-walking, driving, eating or other activity while not fully awake after taking a sleep medicine  suicidal thoughts  an unusual or allergic reaction to zaleplon, other medicines, foods, dyes, or preservatives  pregnant or trying to get pregnant  breast-feeding How should I use this medicine? Take this medicine by mouth with a glass of water. Follow the directions on the prescription label. It is better  to take this medicine on an empty stomach and only when you are ready for bed. Do not take your medicine more often than directed. If you have been taking this medicine for several weeks and suddenly stop taking it, you may get unpleasant withdrawal symptoms. Your doctor or health care professional may want to gradually reduce the dose. Do not stop taking this medicine on your own. Always follow your doctor or health care professional's advice. Talk to your pediatrician regarding the use of this medicine in children. Special care may be needed. Overdosage: If you think you have taken too much of this medicine contact a poison control center or emergency room at once. NOTE: This medicine is only for you. Do not share this medicine with others. What if I miss a dose? This does not apply. This medicine should only be taken  immediately before going to sleep. Do not take double or extra doses. What may interact with this medicine?  barbiturate medicines for inducing sleep or treating seizures  carbamazepine  certain medicines for allergies, like azatadine, clemastine, diphenhydramine  certain medicines for depression, anxiety, or other emotional or psychiatric problems  certain medicines for pain  cimetidine  erythromycin  medicines for fungal infections like ketoconazole, fluconazole, or itraconazole  other medicines given for sleep  phenytoin  rifampin This list may not describe all possible interactions. Give your health care provider a list of all the medicines, herbs, non-prescription drugs, or dietary supplements you use. Also tell them if you smoke, drink alcohol, or use illegal drugs. Some items may interact with your medicine. What should I watch for while using this medicine? Visit your doctor or health care professional for regular checks on your progress. Keep a regular sleep schedule by going to bed at about the same time each night. Avoid caffeine-containing drinks in the evening hours. When sleep medicines are used every night for more than a few weeks, they may stop working. Talk to your doctor if you still have trouble sleeping. After taking this medicine, you may get up out of bed and do an activity that you do not know you are doing. The next morning, you may have no memory of this. Activities include driving a car ("sleep-driving"), making and eating food, talking on the phone, sexual activity, and sleep-walking. Serious injuries have occurred. Stop the medicine and call your doctor right away if you find out you have done any of these activities. Do not take this medicine if you have used alcohol that evening. Do not take it if you have taken another medicine for sleep. The risk of doing these sleep-related activities is higher. Do not take this medicine unless you are able to stay in bed  for a full night (7 to 8 hours) before you must be active again. You may have a decrease in mental alertness the day after use, even if you feel that you are fully awake. Tell your doctor if you will need to perform activities requiring full alertness, such as driving, the next day. Do not stand or sit up quickly after taking this medicine, especially if you are an older patient. This reduces the risk of dizzy or fainting spells. If you or your family notice any changes in your behavior, such as new or worsening depression, thoughts of harming yourself, anxiety, other unusual or disturbing thoughts, or memory loss, call your doctor right away. After you stop taking this medicine, you may have trouble falling asleep. This is called rebound insomnia. This  problem usually goes away on its own after 1 or 2 nights. What side effects may I notice from receiving this medicine? Side effects that you should report to your doctor or health care professional as soon as possible:  allergic reactions like skin rash, itching or hives, swelling of the face, lips, or tongue  breathing problems  changes in vision  confusion  depression, suicidal thoughts  feeling faint or lightheaded  hallucinations  hostility, restlessness, excitability  slurred speech  staggering, tremors  unusual activities while not fully awake like driving, eating, making phone calls  unusually weak or tired Side effects that usually do not require medical attention (report to your doctor or health care professional if they continue or are bothersome):  diarrhea  difficulty with coordination  loss of memory  nightmares  stomach upset This list may not describe all possible side effects. Call your doctor for medical advice about side effects. You may report side effects to FDA at 1-800-FDA-1088. Where should I keep my medicine? Keep out of the reach of children. This medicine can be abused. Keep your medicine in a safe  place to protect it from theft. Do not share this medicine with anyone. Selling or giving away this medicine is dangerous and against the law. This medicine may cause accidental overdose and death if taken by other adults, children, or pets. Mix any unused medicine with a substance like cat litter or coffee grounds. Then throw the medicine away in a sealed container like a sealed bag or a coffee can with a lid. Do not use the medicine after the expiration date. Store at room temperature between 20 and 25 degrees C (68 and 77 degrees F). Protect from light. NOTE: This sheet is a summary. It may not cover all possible information. If you have questions about this medicine, talk to your doctor, pharmacist, or health care provider.  2020 Elsevier/Gold Standard (2018-02-27 12:49:50)

## 2020-08-05 LAB — CBC WITH DIFFERENTIAL/PLATELET
Basophils Absolute: 0 10*3/uL (ref 0.0–0.2)
Basos: 1 %
EOS (ABSOLUTE): 0.1 10*3/uL (ref 0.0–0.4)
Eos: 2 %
Hematocrit: 36 % (ref 34.0–46.6)
Hemoglobin: 11.9 g/dL (ref 11.1–15.9)
Immature Grans (Abs): 0 10*3/uL (ref 0.0–0.1)
Immature Granulocytes: 0 %
Lymphocytes Absolute: 2.6 10*3/uL (ref 0.7–3.1)
Lymphs: 58 %
MCH: 25.5 pg — ABNORMAL LOW (ref 26.6–33.0)
MCHC: 33.1 g/dL (ref 31.5–35.7)
MCV: 77 fL — ABNORMAL LOW (ref 79–97)
Monocytes Absolute: 0.2 10*3/uL (ref 0.1–0.9)
Monocytes: 5 %
Neutrophils Absolute: 1.5 10*3/uL (ref 1.4–7.0)
Neutrophils: 34 %
Platelets: 315 10*3/uL (ref 150–450)
RBC: 4.66 x10E6/uL (ref 3.77–5.28)
RDW: 13.6 % (ref 11.7–15.4)
WBC: 4.5 10*3/uL (ref 3.4–10.8)

## 2020-08-05 LAB — COMPREHENSIVE METABOLIC PANEL
ALT: 26 IU/L (ref 0–32)
AST: 22 IU/L (ref 0–40)
Albumin/Globulin Ratio: 1.8 (ref 1.2–2.2)
Albumin: 4.8 g/dL (ref 3.8–4.9)
Alkaline Phosphatase: 34 IU/L — ABNORMAL LOW (ref 44–121)
BUN/Creatinine Ratio: 14 (ref 9–23)
BUN: 16 mg/dL (ref 6–24)
Bilirubin Total: 0.3 mg/dL (ref 0.0–1.2)
CO2: 24 mmol/L (ref 20–29)
Calcium: 10.4 mg/dL — ABNORMAL HIGH (ref 8.7–10.2)
Chloride: 102 mmol/L (ref 96–106)
Creatinine, Ser: 1.14 mg/dL — ABNORMAL HIGH (ref 0.57–1.00)
GFR calc Af Amer: 62 mL/min/{1.73_m2} (ref >59)
GFR calc non Af Amer: 54 mL/min/{1.73_m2} — ABNORMAL LOW (ref >59)
Globulin, Total: 2.7 g/dL (ref 1.5–4.5)
Glucose: 94 mg/dL (ref 65–99)
Potassium: 4.4 mmol/L (ref 3.5–5.2)
Sodium: 140 mmol/L (ref 134–144)
Total Protein: 7.5 g/dL (ref 6.0–8.5)

## 2020-08-05 LAB — TSH: TSH: 2.36 u[IU]/mL (ref 0.450–4.500)

## 2020-08-05 LAB — LIPID PANEL
Chol/HDL Ratio: 3.5 ratio (ref 0.0–4.4)
Cholesterol, Total: 194 mg/dL (ref 100–199)
HDL: 56 mg/dL (ref >39)
LDL Chol Calc (NIH): 122 mg/dL — ABNORMAL HIGH (ref 0–99)
Triglycerides: 88 mg/dL (ref 0–149)
VLDL Cholesterol Cal: 16 mg/dL (ref 5–40)

## 2020-08-05 LAB — CK: Total CK: 386 U/L — ABNORMAL HIGH (ref 32–182)

## 2020-08-05 LAB — HEMOGLOBIN A1C
Est. average glucose Bld gHb Est-mCnc: 137 mg/dL
Hgb A1c MFr Bld: 6.4 % — ABNORMAL HIGH (ref 4.8–5.6)

## 2020-08-07 ENCOUNTER — Encounter: Payer: Self-pay | Admitting: Physician Assistant

## 2020-08-09 LAB — IRON: Iron: 77 ug/dL (ref 27–159)

## 2020-08-09 LAB — SPECIMEN STATUS REPORT

## 2020-08-11 ENCOUNTER — Encounter: Payer: Self-pay | Admitting: Physician Assistant

## 2020-08-11 DIAGNOSIS — R7989 Other specified abnormal findings of blood chemistry: Secondary | ICD-10-CM

## 2020-08-11 DIAGNOSIS — R748 Abnormal levels of other serum enzymes: Secondary | ICD-10-CM

## 2020-08-13 ENCOUNTER — Encounter: Payer: Self-pay | Admitting: Physician Assistant

## 2020-08-13 DIAGNOSIS — R7989 Other specified abnormal findings of blood chemistry: Secondary | ICD-10-CM

## 2020-08-13 DIAGNOSIS — Z841 Family history of disorders of kidney and ureter: Secondary | ICD-10-CM

## 2020-08-13 DIAGNOSIS — R748 Abnormal levels of other serum enzymes: Secondary | ICD-10-CM

## 2020-08-13 DIAGNOSIS — Z8349 Family history of other endocrine, nutritional and metabolic diseases: Secondary | ICD-10-CM

## 2020-08-23 DIAGNOSIS — R7989 Other specified abnormal findings of blood chemistry: Secondary | ICD-10-CM | POA: Diagnosis not present

## 2020-08-23 DIAGNOSIS — R748 Abnormal levels of other serum enzymes: Secondary | ICD-10-CM | POA: Diagnosis not present

## 2020-08-24 ENCOUNTER — Telehealth: Payer: Self-pay

## 2020-08-24 LAB — RENAL FUNCTION PANEL
Albumin: 4.9 g/dL (ref 3.8–4.9)
BUN/Creatinine Ratio: 15 (ref 9–23)
BUN: 17 mg/dL (ref 6–24)
CO2: 23 mmol/L (ref 20–29)
Calcium: 10.5 mg/dL — ABNORMAL HIGH (ref 8.7–10.2)
Chloride: 102 mmol/L (ref 96–106)
Creatinine, Ser: 1.13 mg/dL — ABNORMAL HIGH (ref 0.57–1.00)
GFR calc Af Amer: 62 mL/min/{1.73_m2} (ref >59)
GFR calc non Af Amer: 54 mL/min/{1.73_m2} — ABNORMAL LOW (ref >59)
Glucose: 110 mg/dL — ABNORMAL HIGH (ref 65–99)
Phosphorus: 3.3 mg/dL (ref 3.0–4.3)
Potassium: 4.6 mmol/L (ref 3.5–5.2)
Sodium: 141 mmol/L (ref 134–144)

## 2020-08-24 LAB — PTH, INTACT AND CALCIUM: PTH: 21 pg/mL (ref 15–65)

## 2020-08-24 NOTE — Telephone Encounter (Signed)
-----   Message from Mar Daring, [place] sent at 08/24/2020 10:21 AM EST ----- Parathyroid hormone is normal. Kidney function is stable. Calcium is stable. Neither increased much, but also did not decrease much.

## 2020-08-24 NOTE — Telephone Encounter (Signed)
Left message advising pt.  (Per DPR)  Thanks,   -Horace Wishon  

## 2020-08-28 ENCOUNTER — Ambulatory Visit
Admission: RE | Admit: 2020-08-28 | Discharge: 2020-08-28 | Disposition: A | Discharge status: Home / Self Care | Payer: 59 | Source: Ambulatory Visit | Attending: Physician Assistant | Admitting: Physician Assistant

## 2020-08-28 ENCOUNTER — Other Ambulatory Visit: Payer: Self-pay

## 2020-08-28 DIAGNOSIS — Z1239 Encounter for other screening for malignant neoplasm of breast: Secondary | ICD-10-CM

## 2020-08-28 DIAGNOSIS — Z1231 Encounter for screening mammogram for malignant neoplasm of breast: Secondary | ICD-10-CM | POA: Insufficient documentation

## 2020-08-29 ENCOUNTER — Other Ambulatory Visit: Payer: Self-pay | Admitting: Podiatry

## 2020-08-29 DIAGNOSIS — M7672 Peroneal tendinitis, left leg: Secondary | ICD-10-CM | POA: Diagnosis not present

## 2020-08-29 DIAGNOSIS — M79672 Pain in left foot: Secondary | ICD-10-CM | POA: Diagnosis not present

## 2020-09-01 ENCOUNTER — Telehealth: Payer: Self-pay

## 2020-09-01 NOTE — Telephone Encounter (Signed)
-----   Message from Mar Daring, [place] sent at 08/31/2020 12:49 PM EST ----- Normal mammogram. Repeat screening in one year.

## 2020-09-01 NOTE — Telephone Encounter (Signed)
Called patient and no answer, L[facility]TRC. If patient calls back okay for [facility] to advise. 

## 2020-09-01 NOTE — Telephone Encounter (Signed)
Patient returned call and notified of normal MM result

## 2020-10-09 ENCOUNTER — Other Ambulatory Visit: Payer: Self-pay | Admitting: Nephrology

## 2020-10-09 DIAGNOSIS — N182 Chronic kidney disease, stage 2 (mild): Secondary | ICD-10-CM | POA: Diagnosis not present

## 2020-10-09 DIAGNOSIS — E1122 Type 2 diabetes mellitus with diabetic chronic kidney disease: Secondary | ICD-10-CM | POA: Diagnosis not present

## 2020-10-09 DIAGNOSIS — I10 Essential (primary) hypertension: Secondary | ICD-10-CM | POA: Diagnosis not present

## 2020-10-18 ENCOUNTER — Ambulatory Visit
Admission: RE | Admit: 2020-10-18 | Discharge: 2020-10-18 | Disposition: A | Discharge status: Home / Self Care | Payer: 59 | Source: Ambulatory Visit | Attending: Nephrology | Admitting: Nephrology

## 2020-10-18 ENCOUNTER — Other Ambulatory Visit: Payer: Self-pay

## 2020-10-18 DIAGNOSIS — N183 Chronic kidney disease, stage 3 unspecified: Secondary | ICD-10-CM | POA: Diagnosis not present

## 2020-10-18 DIAGNOSIS — N182 Chronic kidney disease, stage 2 (mild): Secondary | ICD-10-CM | POA: Insufficient documentation

## 2020-10-30 DIAGNOSIS — N182 Chronic kidney disease, stage 2 (mild): Secondary | ICD-10-CM | POA: Diagnosis not present

## 2020-10-30 DIAGNOSIS — I10 Essential (primary) hypertension: Secondary | ICD-10-CM | POA: Diagnosis not present

## 2020-10-30 DIAGNOSIS — E1122 Type 2 diabetes mellitus with diabetic chronic kidney disease: Secondary | ICD-10-CM | POA: Diagnosis not present

## 2020-11-06 NOTE — Progress Notes (Signed)
[facility]  [address] [place], [place] [address] Phone: [phone]  Fax: [phone]   Clinic Day:  11/07/2020  Referring physician: Anthonette Legato, MD  Chief Complaint: Alexandra Mora is a 60 y.o. female with an abnormal SPEP who is referred in consultation by Dr. Holley Raring for assessment and management.   HPI:  The patient was seen in consultation by Dr. Holley Raring on 10/09/2020 for stage II chronic kidney disease and hypercalcemia.  She has type II diabetes and hypertension.  Hematocrit was 40.3, hemoglobin 13.0, MCV 78.3, platelets 349,000, WBC 5,500.  Creatinine was 1.11 (CrCl 63 ml/min). Calcium was 10.6 with an albumin of 4.9.  PTH intact was 34 (normal).  SPEP revealed a faint restricted band (1.8 g/dL) migrating in the gamma globulin region. UPEP detected a faint albumin band with no abnormal protein bands. She was referred to hematology.  Calcium has fluctuated between 9.7 - 10.6 since 2015. Calcium was 10.4 on 10/30/2020.  Colonoscopy on 06/13/2017 by Dr Allen Norris revealed five 2 to 3 mm polyps in the sigmoid colon (hyperplastic polyps) and one 3 mm polyp in the descending colon (tubular adenoma).  Follow-up colonoscopy is planned in 5 years.  Symptomatically, she feels fine. She is thirsty all the time and has had urinary frequency and nocturia for years. She has also had insomnia for years. She has been struggling with constipation since last year. She tried Linzess but it did not help so she has been experimenting with other medications. She is going through menopause so she is always sweating. She has occasional palpitations and had a normal EKG in 07/2020. Her legs hurt because she is always on her feet.  The patient denies fevers, weight loss, headaches, changes in vision, runny nose, sore throat, cough, shortness of breath, chest pain, nausea, vomiting, diarrhea, reflux, hematuria, vaginal bleeding, blood in the stool, black stools, skin changes,  numbness, and weakness.  She is s/p partial hysterectomy.  She eats well-balanced meals. She eats meat everyday. She eats been trying to eat more dark green leafy vegetables. She denies restless legs, ice pica, and any other cravings.  Her father has chronic kidney disease. She had a maternal uncle with colon cancer and another maternal uncle with gastric cancer. Two of her maternal aunts had breast cancer. She has no family history of multiple myeloma or thalessemia.   Past Medical History:  Diagnosis Date  . Arthritis    lower back  . Benign paroxysmal positional vertigo   . Diabetes mellitus without complication ([place])   . Elevated CK   . GERD (gastroesophageal reflux disease)   . Hypercholesteremia   . Hyperglycemia   . Hypertension   . Insomnia   . Menopausal disorder     Past Surgical History:  Procedure Laterality Date  . ABDOMINAL HYSTERECTOMY    . COLONOSCOPY WITH PROPOFOL N/A 06/13/2017   Procedure: COLONOSCOPY WITH PROPOFOL;  Surgeon: Lucilla Lame, MD;  Location: [facility];  Service: Gastroenterology;  Laterality: N/A;  . LAPAROSCOPIC CHOLECYSTECTOMY    . POLYPECTOMY  06/13/2017   Procedure: POLYPECTOMY;  Surgeon: Lucilla Lame, MD;  Location: [facility];  Service: Gastroenterology;;  . TOTAL ABDOMINAL HYSTERECTOMY W/ BILATERAL SALPINGOOPHORECTOMY      Family History  Problem Relation Age of Onset  . Heart disease Mother   . Heart attack Mother 25  . Hyperlipidemia Mother   . Hypertension Mother   . Stroke Mother   . Hypertension Father   . Hypertension Sister   .  Hypertension Brother   . Hypertension Sister   . Hypertension Sister   . Colon cancer Other   . Diabetes Other   . Breast cancer Maternal Aunt   . Breast cancer Maternal Aunt     Social History:  reports that she has never smoked. She has never used smokeless tobacco. She reports that she does not drink alcohol and does not use drugs. She denies alcohol and tobacco use. She denies  exposure to radiation or toxins. 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 The patient is alone today.  Allergies:  Allergies  Allergen Reactions  . Dexilant [Dexlansoprazole]     Severe diarrhea  . Statins Other (See Comments)    Increased CK levels  . Zetia [Ezetimibe] Other (See Comments)    Increased CK level and muscle cramps    Current Medications: Current Outpatient Medications  Medication Sig Dispense Refill  . Cholecalciferol (VITAMIN D3) 5000 UNITS CAPS Take 1 capsule by mouth daily.     . cyanocobalamin 1000 MCG tablet Take 1,000 mcg by mouth daily.    [name] Sodium (COLACE PO) Take by mouth daily.    . fenofibrate (TRICOR) 145 MG tablet Take 1 tablet (145 mg total) by mouth daily. 90 tablet 3  . lisinopril (ZESTRIL) 10 MG tablet Take 1 tablet (10 mg total) by mouth daily. 90 tablet 3  . pantoprazole (PROTONIX) 40 MG tablet TAKE 1 TABLET BY MOUTH DAILY. 30 tablet 11  . Vitamin D, Ergocalciferol, (DRISDOL) 1.25 MG (50000 UNIT) CAPS capsule Take 1 capsule (50,000 Units total) by mouth every 7 (seven) days. 12 capsule 3   No current facility-administered medications for this visit.    Review of Systems  Constitutional: Positive for diaphoresis. Negative for chills, fever, malaise/fatigue and weight loss.       Thirsty all the time.  HENT: Negative for congestion, ear discharge, ear pain, hearing loss, nosebleeds, sinus pain, sore throat and tinnitus.        Intermittent gum bleeds  Eyes: Negative for blurred vision.  Respiratory: Negative for cough, hemoptysis, sputum production and shortness of breath.   Cardiovascular: Positive for palpitations (occasional). Negative for chest pain and leg swelling.  Gastrointestinal: Positive for constipation. Negative for abdominal pain, blood in stool, diarrhea, heartburn, melena, nausea and vomiting.  Genitourinary: Positive for frequency (x years). Negative for dysuria, hematuria and urgency.        Nocturia  Musculoskeletal: Positive for myalgias (legs ache). Negative for back pain, joint pain and neck pain.  Skin: Negative for itching and rash.  Neurological: Negative for dizziness, tingling, sensory change, weakness and headaches.  Endo/Heme/Allergies: Does not bruise/bleed easily.  Psychiatric/Behavioral: Negative for depression and memory loss. The patient has insomnia (x years). The patient is not nervous/anxious.   All other systems reviewed and are negative.  Performance status (ECOG): 1  Vitals Blood pressure 125/81, pulse 75, temperature (!) 97 F (36.1 C), temperature source Tympanic, weight 241 lb 15.3 oz (109.8 kg), SpO2 99 %.   Physical Exam Vitals and nursing note reviewed.  Constitutional:      General: She is not in acute distress.    Appearance: She is not diaphoretic.  HENT:     Head: Normocephalic and atraumatic.     Mouth/Throat:     Mouth: Mucous membranes are moist.     Pharynx: Oropharynx is clear.  Eyes:     General: No scleral icterus.    Extraocular Movements: Extraocular  movements intact.     Conjunctiva/sclera: Conjunctivae normal.     Pupils: Pupils are equal, round, and reactive to light.     Comments: Glasses.  Brown eyes.  Cardiovascular:     Rate and Rhythm: Normal rate and regular rhythm.     Heart sounds: Normal heart sounds. No murmur heard.   Pulmonary:     Effort: Pulmonary effort is normal. No respiratory distress.     Breath sounds: Normal breath sounds. No wheezing or rales.  Chest:     Chest wall: No tenderness.  Breasts:     Right: No axillary adenopathy or supraclavicular adenopathy.     Left: No axillary adenopathy or supraclavicular adenopathy.    Abdominal:     General: Bowel sounds are normal. There is no distension.     Palpations: Abdomen is soft. There is no mass.     Tenderness: There is no abdominal tenderness. There is no guarding or rebound.  Musculoskeletal:        General: No swelling or tenderness.  Normal range of motion.     Cervical back: Normal range of motion and neck supple.  Lymphadenopathy:     Head:     Right side of head: No preauricular, posterior auricular or occipital adenopathy.     Left side of head: No preauricular, posterior auricular or occipital adenopathy.     Cervical: No cervical adenopathy.     Upper Body:     Right upper body: No supraclavicular or axillary adenopathy.     Left upper body: No supraclavicular or axillary adenopathy.     Lower Body: No right inguinal adenopathy. No left inguinal adenopathy.  Skin:    General: Skin is warm and dry.  Neurological:     Mental Status: She is alert and oriented to person, place, and time.  Psychiatric:        Behavior: Behavior normal.        Thought Content: Thought content normal.        Judgment: Judgment normal.    No visits with results within 3 Day(s) from this visit.  Latest known visit with results is:  Patient Message on 08/11/2020  Component Date Value Ref Range Status  . PTH 08/23/2020 21  15 - 65 pg/mL Final  . PTH Interp 08/23/2020 Comment   Final   Comment: Interpretation                 Intact PTH    Calcium                                 (pg/mL)      (mg/dL) Normal                          15 - 65     8.6 - 10.2 Primary Hyperparathyroidism         >65          >10.2 Secondary Hyperparathyroidism       >65          <10.2 Non-Parathyroid Hypercalcemia       <65          >10.2 Hypoparathyroidism                  <15          < 8.6 Non-Parathyroid Hypocalcemia    15 - 65          <  8.6   . Glucose 08/23/2020 110* 65 - 99 mg/dL Final  . BUN 08/23/2020 17  6 - 24 mg/dL Final  . Creatinine, Ser 08/23/2020 1.13* 0.57 - 1.00 mg/dL Final  . GFR calc non Af Amer 08/23/2020 54* >59 mL/min/1.73 Final  . GFR calc Af Amer 08/23/2020 62  >59 mL/min/1.73 Final   Comment: **In accordance with recommendations from the NKF-ASN Task force,**   [facility] is in the process of updating its eGFR calculation to  the   2021 CKD-EPI creatinine equation that estimates kidney function   without a race variable.   . BUN/Creatinine Ratio 08/23/2020 15  9 - 23 Final  . Sodium 08/23/2020 141  134 - 144 mmol/L Final  . Potassium 08/23/2020 4.6  3.5 - 5.2 mmol/L Final  . Chloride 08/23/2020 102  96 - 106 mmol/L Final  . CO2 08/23/2020 23  20 - 29 mmol/L Final  . Calcium 08/23/2020 10.5* 8.7 - 10.2 mg/dL Final  . Phosphorus 08/23/2020 3.3  3.0 - 4.3 mg/dL Final  . Albumin 08/23/2020 4.9  3.8 - 4.9 g/dL Final    Assessment:  Alexandra Mora is a 60 y.o. female with an abnormal SPEP and hypercalcemia.  Labs on 10/09/2020 revealed a hematocrit of 40.3, hemoglobin 13.0, MCV 78.3, platelets 349,000, WBC 5,500.  Creatinine was 1.11 (CrCl 63 ml/min). Calcium was 10.6. SPEP revealed a faint restricted band (1.8 g/dL) migrating in the gamma globulin region. UPEP detected a faint albumin band with no abnormal protein bands.  She has microcytic RBC indices.  Colonoscopy on 06/13/2017 revealed five 2 to 3 mm polyps in the sigmoid colon (hyperplastic polyps) and one 3 mm polyp in the descending colon (tubular adenoma).  Follow-up colonoscopy is planned in 5 years.  Symptomatically, she feels fine.  She notes a good diet.  She denies any bleeding.  Exam is unremarkable.  Plan: 1.   Labs today:  CBC with diff, ionized calcium, myeloma panel, FLCA, ferritin, iron studies, sed rate. 2.   24 hour urine for UPEP and free light chains. 3.   Abnormal SPEP  Review labs from 10/09/2020 which revealed a faint restricted band in the gammaglobulin region.   Significance is unclear.  Etiology is unclear and may be due to a monoclonal gammopathy of unknown significance.  Discuss work-up. 4.   Hypercalcemia  Calcium has ranged between 9.7 - 10.6 since 06/2014.  Albumin has ranged between 4.5 - 4.9 since 06/2014.   Suspect ionized calcium is normal. 5.   Microcytic RBC indices  Hemoglobin has ranged between 11.8 - 12.6.  MCV has  ranged between 76-80 since 06/2014.   Ferritin was 368 on 01/14/2020. 6.   RTC in 1 week for MD assess, review of work-up and discussion regarding direction of therapy.  I discussed the assessment and treatment plan with the patient.  The patient was provided an opportunity to ask questions and all were answered.  The patient agreed with the plan and demonstrated an understanding of the instructions.  The patient was advised to call back if the symptoms worsen or if the condition fails to improve as anticipated.  I provided 25 minutes of face-to-face time during this this encounter and > 50% was spent counseling as documented under my assessment and plan. An additional 10-12 minutes were spent reviewing her chart (Epic and Care Everywhere) including notes, labs, and imaging studies.    Ahilyn Nell C. Mike Gip, MD, PhD    11/07/2020, 11:51 AM  I, Mirian Mo  Tufford, am acting as Education administrator for [employer]. Mike Gip, MD, PhD.  I, Phyillis Dascoli C. Mike Gip, MD, have reviewed the above documentation for accuracy and completeness, and I agree with the above.

## 2020-11-07 ENCOUNTER — Encounter: Payer: Self-pay | Admitting: Gastroenterology

## 2020-11-07 ENCOUNTER — Ambulatory Visit: Payer: 59 | Admitting: Gastroenterology

## 2020-11-07 ENCOUNTER — Encounter: Payer: Self-pay | Admitting: Hematology and Oncology

## 2020-11-07 ENCOUNTER — Other Ambulatory Visit: Payer: Self-pay

## 2020-11-07 ENCOUNTER — Other Ambulatory Visit: Payer: Self-pay | Admitting: Gastroenterology

## 2020-11-07 ENCOUNTER — Inpatient Hospital Stay: Payer: 59 | Attending: Hematology and Oncology | Admitting: Hematology and Oncology

## 2020-11-07 ENCOUNTER — Inpatient Hospital Stay: Payer: 59

## 2020-11-07 VITALS — BP 94/62 | HR 97 | Ht 72.0 in | Wt 242.0 lb

## 2020-11-07 VITALS — BP 125/81 | HR 75 | Temp 97.0°F | Wt 242.0 lb

## 2020-11-07 DIAGNOSIS — N182 Chronic kidney disease, stage 2 (mild): Secondary | ICD-10-CM | POA: Diagnosis not present

## 2020-11-07 DIAGNOSIS — I129 Hypertensive chronic kidney disease with stage 1 through stage 4 chronic kidney disease, or unspecified chronic kidney disease: Secondary | ICD-10-CM | POA: Insufficient documentation

## 2020-11-07 DIAGNOSIS — Z79899 Other long term (current) drug therapy: Secondary | ICD-10-CM | POA: Insufficient documentation

## 2020-11-07 DIAGNOSIS — R778 Other specified abnormalities of plasma proteins: Secondary | ICD-10-CM

## 2020-11-07 DIAGNOSIS — E1122 Type 2 diabetes mellitus with diabetic chronic kidney disease: Secondary | ICD-10-CM | POA: Diagnosis not present

## 2020-11-07 DIAGNOSIS — K219 Gastro-esophageal reflux disease without esophagitis: Secondary | ICD-10-CM

## 2020-11-07 DIAGNOSIS — R718 Other abnormality of red blood cells: Secondary | ICD-10-CM | POA: Insufficient documentation

## 2020-11-07 DIAGNOSIS — K760 Fatty (change of) liver, not elsewhere classified: Secondary | ICD-10-CM

## 2020-11-07 LAB — CBC WITH DIFFERENTIAL/PLATELET
Abs Immature Granulocytes: 0.01 10*3/uL (ref 0.00–0.07)
Basophils Absolute: 0 10*3/uL (ref 0.0–0.1)
Basophils Relative: 1 %
Eosinophils Absolute: 0.1 10*3/uL (ref 0.0–0.5)
Eosinophils Relative: 1 %
HCT: 39.5 % (ref 36.0–46.0)
Hemoglobin: 12.4 g/dL (ref 12.0–15.0)
Immature Granulocytes: 0 %
Lymphocytes Relative: 59 %
Lymphs Abs: 2.8 10*3/uL (ref 0.7–4.0)
MCH: 24.4 pg — ABNORMAL LOW (ref 26.0–34.0)
MCHC: 31.4 g/dL (ref 30.0–36.0)
MCV: 77.8 fL — ABNORMAL LOW (ref 80.0–100.0)
Monocytes Absolute: 0.3 10*3/uL (ref 0.1–1.0)
Monocytes Relative: 6 %
Neutro Abs: 1.6 10*3/uL — ABNORMAL LOW (ref 1.7–7.7)
Neutrophils Relative %: 33 %
Platelets: 328 10*3/uL (ref 150–400)
RBC: 5.08 MIL/uL (ref 3.87–5.11)
RDW: 14.6 % (ref 11.5–15.5)
WBC: 4.8 10*3/uL (ref 4.0–10.5)
nRBC: 0 % (ref 0.0–0.2)

## 2020-11-07 LAB — IRON AND TIBC
Iron: 59 ug/dL (ref 28–170)
Saturation Ratios: 12 % (ref 10.4–31.8)
TIBC: 493 ug/dL — ABNORMAL HIGH (ref 250–450)
UIBC: 434 ug/dL

## 2020-11-07 LAB — SEDIMENTATION RATE: Sed Rate: 10 mm/hr (ref 0–30)

## 2020-11-07 LAB — FERRITIN: Ferritin: 323 ng/mL — ABNORMAL HIGH (ref 11–307)

## 2020-11-07 MED ORDER — ESOMEPRAZOLE MAGNESIUM 40 MG PO CPDR: 40.0000 mg | DELAYED_RELEASE_CAPSULE | Freq: Every day | ORAL | 11 refills | Status: DC

## 2020-11-07 NOTE — Progress Notes (Addendum)
Primary Care Physician: Mar Daring, PA-C  Primary Gastroenterologist:  Dr. Lucilla Lame  No chief complaint on file.   HPI: Alexandra Mora is a 60 y.o. female here with a history of heartburn and was found to have fatty liver. The patient states that she has borderline diabetes and was sent to urology for some concerns about her kidneys.  The patient then had a ultrasound that showed the visualized portion of the liver to show fatty infiltrate.  The patient's liver enzymes have been normal.  The patient reports that she has been doing well on her present PPI but usually has to switch between her PPIs on a yearly basis to keep her heartburn under control.  There is no report of any unexplained weight loss fevers chills nausea or vomiting.  She also states that she is due for colonoscopy next year.  Past Medical History:  Diagnosis Date  . Arthritis    lower back  . Benign paroxysmal positional vertigo   . Diabetes mellitus without complication ([place])   . Elevated CK   . GERD (gastroesophageal reflux disease)   . Hypercholesteremia   . Hyperglycemia   . Hypertension   . Insomnia   . Menopausal disorder     Current Outpatient Medications  Medication Sig Dispense Refill  . Cholecalciferol (VITAMIN D3) 5000 UNITS CAPS Take 1 capsule by mouth daily.     . cyanocobalamin 1000 MCG tablet Take 1,000 mcg by mouth daily.    Alexandra Mora Sodium (COLACE PO) Take by mouth daily.    Marland Kitchen esomeprazole (NEXIUM) 40 MG capsule Take 1 capsule (40 mg total) by mouth daily. 30 capsule 11  . fenofibrate (TRICOR) 145 MG tablet Take 1 tablet (145 mg total) by mouth daily. 90 tablet 3  . lisinopril (ZESTRIL) 10 MG tablet Take 1 tablet (10 mg total) by mouth daily. 90 tablet 3  . pantoprazole (PROTONIX) 40 MG tablet TAKE 1 TABLET BY MOUTH DAILY. 30 tablet 11  . Vitamin D, Ergocalciferol, (DRISDOL) 1.25 MG (50000 UNIT) CAPS capsule Take 1 capsule (50,000 Units total) by mouth every 7 (seven) days. 12  capsule 3   No current facility-administered medications for this visit.    Allergies as of 11/07/2020 - Review Complete 11/07/2020  Allergen Reaction Noted  . Dexilant [dexlansoprazole]  01/31/2014  . Statins Other (See Comments) 01/27/2017  . Zetia [ezetimibe] Other (See Comments) 03/13/2020    ROS:  General: Negative for anorexia, weight loss, fever, chills, fatigue, weakness. ENT: Negative for hoarseness, difficulty swallowing , nasal congestion. CV: Negative for chest pain, angina, palpitations, dyspnea on exertion, peripheral edema.  Respiratory: Negative for dyspnea at rest, dyspnea on exertion, cough, sputum, wheezing.  GI: See history of present illness. GU:  Negative for dysuria, hematuria, urinary incontinence, urinary frequency, nocturnal urination.  Endo: Negative for unusual weight change.    Physical Examination:   BP 94/62   Pulse 97   Ht 6' (1.829 m)   Wt 242 lb (109.8 kg)   BMI 32.82 kg/m   General: Well-nourished, well-developed in no acute distress.  Eyes: No icterus. Conjunctivae pink. Lungs: Clear to auscultation bilaterally. Non-labored. Heart: Regular rate and rhythm, no murmurs rubs or gallops.  Abdomen: Bowel sounds are normal, nontender, nondistended, no hepatosplenomegaly or masses, no abdominal bruits or hernia , no rebound or guarding.   Extremities: No lower extremity edema. No clubbing or deformities. Neuro: Alert and oriented x 3.  Grossly intact. Skin: Warm and dry, no jaundice.  Psych: Alert and cooperative, normal mood and affect.  Labs:    Imaging Studies: US RENAL  Result Date: 10/19/2020 CLINICAL DATA:  Stage 3 chronic kidney disease EXAM: RENAL / URINARY TRACT ULTRASOUND COMPLETE COMPARISON:  None. FINDINGS: Right Kidney: Renal measurements: 10.8 x 4.1 x 5.6 cm = volume: 129 mL. Echogenicity within normal limits. No mass or hydronephrosis visualized. Left Kidney: Renal measurements: 13.1 x 10.4 x 6.2 cm = volume: 272 mL.  Echogenicity within normal limits. No mass or hydronephrosis visualized. Bladder: Appears normal for degree of bladder distention. Other: Diffuse increased echogenicity of the visualized portions of the liver indicative of hepatocellular dysfunction, most likely steatosis. IMPRESSION: Normal sonographic appearance of the kidneys. Electronically Signed   By: Miachel Roux M.D.   On: 10/19/2020 07:55    Assessment and Plan:   Alexandra Mora is a 60 y.o. y/o female who comes in today with a diagnosis of fatty liver disease with abnormal liver enzymes. The patient has been told to try and lose 7% of her body weight to decrease the fat in her liver and keep her cholesterol to control.  She states that she cannot take multiple types of cholesterol medications are limited her ability to fight the high cholesterol.  She also has been encouraged to undergo an upper endoscopy and would like to do it next year when she is due for her colonoscopy. No further workup needs to be done for her liver since her liver enzymes are normal and she has no sign of steatohepatitis. The patient would also like to be switched back to her Nexium this year and a prescription for that will be given.  The patient has been explained the plan and agrees with it.     Lucilla Lame, MD. Marval Regal    Note: This dictation was prepared with Dragon dictation along with smaller phrase technology. Any transcriptional errors that result from this process are unintentional.

## 2020-11-08 LAB — KAPPA/LAMBDA LIGHT CHAINS
Kappa free light chain: 21.1 mg/L — ABNORMAL HIGH (ref 3.3–19.4)
Kappa, lambda light chain ratio: 2.27 — ABNORMAL HIGH (ref 0.26–1.65)
Lambda free light chains: 9.3 mg/L (ref 5.7–26.3)

## 2020-11-08 LAB — CALCIUM, IONIZED: Calcium, Ionized, Serum: 5.5 mg/dL (ref 4.5–5.6)

## 2020-11-09 LAB — MULTIPLE MYELOMA PANEL, SERUM
Albumin SerPl Elph-Mcnc: 4.3 g/dL (ref 2.9–4.4)
Albumin/Glob SerPl: 1.3 (ref 0.7–1.7)
Alpha 1: 0.2 g/dL (ref 0.0–0.4)
Alpha2 Glob SerPl Elph-Mcnc: 0.5 g/dL (ref 0.4–1.0)
B-Globulin SerPl Elph-Mcnc: 1.4 g/dL — ABNORMAL HIGH (ref 0.7–1.3)
Gamma Glob SerPl Elph-Mcnc: 1.3 g/dL (ref 0.4–1.8)
Globulin, Total: 3.5 g/dL (ref 2.2–3.9)
IgA: 271 mg/dL (ref 87–352)
IgG (Immunoglobin G), Serum: 1411 mg/dL (ref 586–1602)
IgM (Immunoglobulin M), Srm: 98 mg/dL (ref 26–217)
Total Protein ELP: 7.8 g/dL (ref 6.0–8.5)

## 2020-11-13 ENCOUNTER — Other Ambulatory Visit
Admission: RE | Admit: 2020-11-13 | Discharge: 2020-11-13 | Disposition: A | Discharge status: Home / Self Care | Payer: 59 | Source: Ambulatory Visit | Attending: Hematology and Oncology | Admitting: Hematology and Oncology

## 2020-11-13 DIAGNOSIS — R778 Other specified abnormalities of plasma proteins: Secondary | ICD-10-CM | POA: Diagnosis not present

## 2020-11-14 LAB — UPEP/TP, 24-HR URINE
Albumin, U: 0 %
Alpha 1, Urine: 0 %
Alpha 2, Urine: 0 %
Beta, Urine: 0 %
Gamma Globulin, Urine: 0 %
Total Protein, Urine-Ur/day: 178 mg/24 hr — ABNORMAL HIGH (ref 30–150)
Total Protein, Urine: 7.4 mg/dL
Total Volume: 2400

## 2020-11-14 LAB — FREE K+L LT CHAINS,QN,UR
Free Kappa Lt Chains,Ur: 3.3 mg/L (ref 0.63–113.79)
Free Kappa/Lambda Ratio: >4.93 (ref 1.03–31.76)
Free Lambda Lt Chains,Ur: <0.67 mg/L (ref 0.47–11.77)
Total Volume: 2400

## 2020-11-14 NOTE — Progress Notes (Signed)
[facility]  [address], [address] [place], [place] [address] Phone: [phone]  Fax: [phone]   Clinic Day:  11/15/2020  Referring physician: Florian Buff*  Chief Complaint: Alexandra Mora is a 60 y.o. female with an abnormal SPEP who is seen for review of work-up and discussion regarding direction of therapy.  HPI:  The patient was last seen in the hematology clinic on 11/07/2020.  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  Labs were notable for a creatinine of 1.11 and a calcium of 10.6 (albumin 4.9).  SPEP had revealed a faint restricted band migrating in the gammaglobulin region.  UPEP detected a faint albumin band with no abnormal protein bands.  Symptomatically, she felt "fine".  Work-up revealed a hematocrit of 39.5, hemoglobin 12.4, MCV 77.8, platelets 328,000, WBC 4,800 with an ANC of 1600. Ionized calcium was 5.5 (normal). Ferritin was 323 with an iron saturation of 12% and a TIBC of 493. Sed rate was 10. M spike was 0. Kappa free light chains were 21.1, lambda free light chains 9.3, ratio 2.27. 24-hour urine revealed no monoclonal protein.  During the interim, she has been "good." She is cold all the time and gets tired every now and then. She was thirsty all the time, but this has improved. Her urinary frequency, insomnia, constipation, sweating are stable. Her leg aches, gum bleeds, and palpitations have resolved. She denies hematuria but does have occasional stinging with urination.  The patient feels comfortable following up prn.   Past Medical History:  Diagnosis Date  . Arthritis    lower back  . Benign paroxysmal positional vertigo   . Diabetes mellitus without complication ([place])   . Elevated CK   . GERD (gastroesophageal reflux disease)   . Hypercholesteremia   . Hyperglycemia   . Hypertension   . Insomnia   . Menopausal disorder     Past Surgical History:  Procedure Laterality Date  . ABDOMINAL  HYSTERECTOMY    . COLONOSCOPY WITH PROPOFOL N/A 06/13/2017   Procedure: COLONOSCOPY WITH PROPOFOL;  Surgeon: Lucilla Lame, MD;  Location: [place];  Service: Gastroenterology;  Laterality: N/A;  . LAPAROSCOPIC CHOLECYSTECTOMY    . POLYPECTOMY  06/13/2017   Procedure: POLYPECTOMY;  Surgeon: Lucilla Lame, MD;  Location: [facility];  Service: Gastroenterology;;  . TOTAL ABDOMINAL HYSTERECTOMY W/ BILATERAL SALPINGOOPHORECTOMY      Family History  Problem Relation Age of Onset  . Heart disease Mother   . Heart attack Mother 51  . Hyperlipidemia Mother   . Hypertension Mother   . Stroke Mother   . Hypertension Father   . Hypertension Sister   . Hypertension Brother   . Hypertension Sister   . Hypertension Sister   . Colon cancer Other   . Diabetes Other   . Breast cancer Maternal Aunt   . Breast cancer Maternal Aunt     Social History:  reports that she has never smoked. She has never used smokeless tobacco. She reports that she does not drink alcohol and does not use drugs. She denies alcohol and tobacco use. She denies exposure to radiation or toxins. 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 The patient is alone today.  Allergies:  Allergies  Allergen Reactions  . Dexilant [Dexlansoprazole]     Severe diarrhea  . Statins Other (See Comments)    Increased CK levels  . Zetia [Ezetimibe] Other (See Comments)  Increased CK level and muscle cramps    Current Medications: Current Outpatient Medications  Medication Sig Dispense Refill  . Cholecalciferol (VITAMIN D3) 5000 UNITS CAPS Take 1 capsule by mouth daily.     . cyanocobalamin 1000 MCG tablet Take 2,000 mcg by mouth daily.    [name] Sodium (COLACE PO) Take by mouth daily.    Marland Kitchen esomeprazole (NEXIUM) 40 MG capsule Take 1 capsule (40 mg total) by mouth daily. 30 capsule 11  . fenofibrate (TRICOR) 145 MG tablet Take 1 tablet (145 mg total) by mouth daily. 90 tablet 3  . lisinopril  (ZESTRIL) 10 MG tablet Take 1 tablet (10 mg total) by mouth daily. 90 tablet 3  . pantoprazole (PROTONIX) 40 MG tablet TAKE 1 TABLET BY MOUTH DAILY. 30 tablet 11  . Vitamin D, Ergocalciferol, (DRISDOL) 1.25 MG (50000 UNIT) CAPS capsule Take 1 capsule (50,000 Units total) by mouth every 7 (seven) days. 12 capsule 3  . dapagliflozin propanediol (FARXIGA) 5 MG TABS tablet Take 1 tablet (5 mg total) by mouth daily before breakfast. (Patient not taking: Reported on 11/15/2020) 90 tablet 1   No current facility-administered medications for this visit.    Review of Systems  Constitutional: Positive for malaise/fatigue (every now and then) and weight loss (5 lbs). Negative for chills, diaphoresis and fever.       Feels "good."  HENT: Negative for congestion, ear discharge, ear pain, hearing loss, nosebleeds, sinus pain, sore throat and tinnitus.   Eyes: Negative for blurred vision.  Respiratory: Negative for cough, hemoptysis, sputum production and shortness of breath.   Cardiovascular: Negative for chest pain, palpitations and leg swelling.  Gastrointestinal: Positive for constipation (chronic). Negative for abdominal pain, blood in stool, diarrhea, heartburn, melena, nausea and vomiting.  Genitourinary: Positive for frequency (x years). Negative for dysuria, hematuria and urgency.       Nocturia  Musculoskeletal: Negative for back pain, joint pain, myalgias and neck pain.  Skin: Negative for itching and rash.  Neurological: Negative for dizziness, tingling, sensory change, weakness and headaches.  Endo/Heme/Allergies: Does not bruise/bleed easily.       Stays cold  Psychiatric/Behavioral: Negative for depression and memory loss. The patient has insomnia (x years). The patient is not nervous/anxious.   All other systems reviewed and are negative.  Performance status (ECOG): 1  Vitals Blood pressure 109/68, pulse 67, temperature (!) 96.3 F (35.7 C), temperature source Tympanic, weight 237 lb 3.4  oz (107.6 kg), SpO2 99 %.   Physical Exam Vitals and nursing note reviewed.  Constitutional:      General: She is not in acute distress.    Appearance: She is not diaphoretic.  Eyes:     General: No scleral icterus.    Conjunctiva/sclera: Conjunctivae normal.  Neurological:     Mental Status: She is alert.  Psychiatric:        Behavior: Behavior normal.        Thought Content: Thought content normal.        Judgment: Judgment normal.    Office Visit on 11/15/2020  Component Date Value Ref Range Status  . Hemoglobin A1C 11/15/2020 6.2* 4.0 - 5.6 % Final  Hospital Outpatient Visit on 11/13/2020  Component Date Value Ref Range Status  . Total Protein, Urine 11/13/2020 7.4  Not Estab. mg/dL Final  . Total Protein, Urine-Ur/day 11/13/2020 178* 30 - 150 mg/24 hr Final  . Albumin, U 11/13/2020 0.0  % Final   Comment: (NOTE) No urine protein electrophoretic pattern was  observed after concentration. However, there is a detectable amount of protein by a quantitative spectrophotometric method.   . Alpha 1, Urine 11/13/2020 0.0  % Final  . Alpha 2, Urine 11/13/2020 0.0  % Final  . Beta, Urine 11/13/2020 0.0  % Final  . Gamma Globulin, Urine 11/13/2020 0.0  % Final  . M-spike, % 11/13/2020 Not Observed  Not Observed % Final  . Please Note: 11/13/2020 Comment   Final   Comment: (NOTE) Protein electrophoresis scan will follow via computer, mail, or courier delivery. Performed At: [facility] [place], [place] [id number] Rush Farmer MD [id number]   . Total Volume 11/13/2020 2,400   Final   Performed at [facility], [address] [place], [place] [address]  . Free Kappa Lt Chains,Ur 11/13/2020 3.30  0.63 - 113.79 mg/L Final   Comment: (NOTE) **Effective December 04, 2020 the reference interval**  for Free Kappa Lt Chains,Ur will be changing  to:                                      1.17 - 86.46   . Free Lambda Lt Chains,Ur 11/13/2020 <0.67   0.47 - 11.77 mg/L Final   Comment: (NOTE) **Effective December 04, 2020 the reference interval**  for Free Lambda Lt Chains,Ur will be changing  to:                                      0.27 - 15.21   . Free Kappa/Lambda Ratio 11/13/2020 >4.93  1.03 - 31.76 Final   Comment: (NOTE) **Effective December 04, 2020 the reference interval**  for Kappa/Lambda Ratio,U will be changing to:                                           1.83 - 14.26 Performed At: [facility] [place], [place] [id number] Rush Farmer MD [id number]   . Total Volume 11/13/2020 2,400   Final   Performed at [facility], [address] [place], [place] [address]    Assessment:  KADINCE BOXLEY is a 60 y.o. female with an abnormal SPEP and hypercalcemia.  Labs on 10/09/2020 revealed a hematocrit of 40.3, hemoglobin 13.0, MCV 78.3, platelets 349,000, WBC 5,500.  Creatinine was 1.11 (CrCl 63 ml/min). Calcium was 10.6. SPEP revealed a faint restricted band (1.8 g/dL) migrating in the gamma globulin region. UPEP detected a faint albumin band with no abnormal protein bands.  Work-up on 11/07/2020 revealed a hematocrit of 39.5, hemoglobin 12.4, MCV 77.8, platelets 328,000, WBC 4,800 with an ANC of 1600. Ionized calcium was 5.5 (normal). Ferritin was 323 with an iron saturation of 12% and a TIBC of 493.  Sed rate was 10. M-spike was 0. Kappa free light chains were 21.1, lambda free light chains 9.3, ratio 2.27.  24-hour urine revealed no monoclonal protein.  She has microcytic RBC indices.  Colonoscopy on 06/13/2017 revealed five 2 to 3 mm polyps in the sigmoid colon (hyperplastic polyps) and one 3 mm polyp in the descending colon (tubular adenoma).  Follow-up colonoscopy is planned in 5 years.  Symptomatically,  she feels "good." She is cold all the time and gets tired every now  and then. Urinary frequency, insomnia, constipation, sweating are stable. Her leg aches, gum bleeds, and palpitations have  resolved. She denies hematuria.  Plan: 1.   Review work-up. 2.   Abnormal SPEP, resolved  Work-up revealed no monoclonal protein.  Free light chin ratio was minimally elevated.  24 hour urine revealed no monoclonal protein.  Reassurance provided. 3.   Hypercalcemia, resolved  Calcium has ranged between 9.7 - 10.6 since 06/2014.  Albumin has ranged between 4.5 - 4.9 since 06/2014.   Ionized calcium is normal. 4.   Microcytic RBC indices  Hemoglobin has ranged between 11.8 - 12.6.  MCV has ranged between 76-80 since 06/2014.  Ferritin was 323 with an iron saturation of 12% and a TIBC of 493.  Patient may have an underlying thalassemia trait. 5.   RTC prn.  I discussed the assessment and treatment plan with the patient.  The patient was provided an opportunity to ask questions and all were answered.  The patient agreed with the plan and demonstrated an understanding of the instructions.  The patient was advised to call back if the symptoms worsen or if the condition fails to improve as anticipated.   Pearlie Lafosse C. Mike Gip, MD, PhD    11/15/2020, 10:58 AM  I, Mirian Mo Tufford, am acting as Education administrator for [employer]. Mike Gip, MD, PhD.  I, Meril Dray C. Mike Gip, MD, have reviewed the above documentation for accuracy and completeness, and I agree with the above.

## 2020-11-15 ENCOUNTER — Ambulatory Visit: Payer: 59 | Admitting: Physician Assistant

## 2020-11-15 ENCOUNTER — Encounter: Payer: Self-pay | Admitting: Physician Assistant

## 2020-11-15 ENCOUNTER — Encounter: Payer: Self-pay | Admitting: Hematology and Oncology

## 2020-11-15 ENCOUNTER — Inpatient Hospital Stay: Payer: 59 | Attending: Hematology and Oncology | Admitting: Hematology and Oncology

## 2020-11-15 ENCOUNTER — Other Ambulatory Visit: Payer: Self-pay | Admitting: Physician Assistant

## 2020-11-15 ENCOUNTER — Other Ambulatory Visit: Payer: Self-pay

## 2020-11-15 VITALS — BP 109/68 | HR 67 | Temp 96.3°F | Wt 237.2 lb

## 2020-11-15 VITALS — BP 113/75 | HR 71 | Temp 98.4°F | Wt 237.0 lb

## 2020-11-15 DIAGNOSIS — R718 Other abnormality of red blood cells: Secondary | ICD-10-CM

## 2020-11-15 DIAGNOSIS — R779 Abnormality of plasma protein, unspecified: Secondary | ICD-10-CM | POA: Diagnosis not present

## 2020-11-15 DIAGNOSIS — E1122 Type 2 diabetes mellitus with diabetic chronic kidney disease: Secondary | ICD-10-CM

## 2020-11-15 DIAGNOSIS — N183 Type 2 diabetes mellitus with diabetic chronic kidney disease: Secondary | ICD-10-CM

## 2020-11-15 DIAGNOSIS — N182 Chronic kidney disease, stage 2 (mild): Secondary | ICD-10-CM | POA: Insufficient documentation

## 2020-11-15 DIAGNOSIS — R778 Other specified abnormalities of plasma proteins: Secondary | ICD-10-CM | POA: Diagnosis not present

## 2020-11-15 DIAGNOSIS — K760 Fatty (change of) liver, not elsewhere classified: Secondary | ICD-10-CM | POA: Insufficient documentation

## 2020-11-15 DIAGNOSIS — I129 Hypertensive chronic kidney disease with stage 1 through stage 4 chronic kidney disease, or unspecified chronic kidney disease: Secondary | ICD-10-CM | POA: Insufficient documentation

## 2020-11-15 DIAGNOSIS — G720 Drug-induced myopathy: Secondary | ICD-10-CM

## 2020-11-15 DIAGNOSIS — N1831 Chronic kidney disease, stage 3a: Secondary | ICD-10-CM

## 2020-11-15 LAB — POCT GLYCOSYLATED HEMOGLOBIN (HGB A1C): Hemoglobin A1C: 6.2 % — AB (ref 4.0–5.6)

## 2020-11-15 MED ORDER — DAPAGLIFLOZIN PROPANEDIOL 5 MG PO TABS: 5.0000 mg | ORAL_TABLET | Freq: Every day | ORAL | 1 refills | Status: DC

## 2020-11-15 NOTE — Progress Notes (Signed)
Established patient visit   Patient: Alexandra Mora   DOB: September 10, 1961   60 y.o. Female  MRN: [id number] Visit Date: 11/15/2020  Today's healthcare provider: Mar Daring, PA-C   Chief Complaint  Patient presents with  . Diabetes   Subjective    HPI  Diabetes Mellitus Type II, follow-up  Lab Results  Component Value Date   HGBA1C 6.2 (A) 11/15/2020   HGBA1C 6.4 (H) 08/04/2020   HGBA1C 6.2 (H) 01/14/2020   Last seen for diabetes 3 months ago.  Management since then includes continuing the same treatment. She reports excellent compliance with treatment. She is not having side effects.   Home blood sugar records: postprandial range: 130-140's  Episodes of hypoglycemia? No    Current insulin regiment: None Most Recent Eye Exam: pt is due for an eye exam   Symptoms: No appetite changes No foot ulcerations  No chest pain No chest pressure/discomfort  No dyspnea No orthopnea  No fatigue No lower extremity edema  No palpitations No paroxysmal nocturnal dyspnea  No nausea No numbness or tingling of extremity  No polydipsia No polyuria  No speech difficulty No syncope   She is following a Regular diet. Current exercise: none  Last metabolic panel Lab Results  Component Value Date   GLUCOSE 110 (H) 08/23/2020   NA 141 08/23/2020   K 4.6 08/23/2020   BUN 17 08/23/2020   CREATININE 1.13 (H) 08/23/2020   GFRNONAA 54 (L) 08/23/2020   GFRAA 62 08/23/2020   CALCIUM 10.5 (H) 08/23/2020   AST 22 08/04/2020   ALT 26 08/04/2020   The 10-year ASCVD risk score Mikey Bussing DC Jr., et al., 2013) is: 8.2%  ---------------------------------------------------------------------------------------------------   Patient Active Problem List   Diagnosis Date Noted  . Stage 3a chronic kidney disease ([place]) 11/15/2020  . Abnormal SPEP 11/07/2020  . Microcytic red blood cells 11/07/2020  . History of colon polyps   . Polyp of sigmoid colon   . Benign neoplasm of descending  colon   . Elevated rheumatoid factor 01/22/2017  . Hypercalcemia 01/22/2017  . Muscle cramps 01/22/2017  . Low back pain 10/04/2015  . Diabetes type 2, controlled ([place]) 07/05/2015  . Hematuria 03/29/2015  . Acute stress disorder 01/25/2015  . Benign paroxysmal positional nystagmus 01/25/2015  . Elevated CK 01/25/2015  . Big thyroid 01/25/2015  . BP (high blood pressure) 01/25/2015  . Low serum cobalamin 01/25/2015  . Avitaminosis D 01/25/2015  . GERD (gastroesophageal reflux disease) 01/31/2014  . Family history of coronary artery disease 01/31/2014  . Shoulder pain 01/31/2014  . Abnormal EKG 01/31/2014  . Insomnia 03/17/2008  . Family history of colon cancer 09/16/2007  . Hypercholesteremia 01/05/2007   Past Medical History:  Diagnosis Date  . Arthritis    lower back  . Benign paroxysmal positional vertigo   . Diabetes mellitus without complication ([place])   . Elevated CK   . GERD (gastroesophageal reflux disease)   . Hypercholesteremia   . Hyperglycemia   . Hypertension   . Insomnia   . Menopausal disorder    Social History   Tobacco Use  . Smoking status: Never Smoker  . Smokeless tobacco: Never Used  Substance Use Topics  . Alcohol use: No  . Drug use: No   Allergies  Allergen Reactions  . Dexilant [Dexlansoprazole]     Severe diarrhea  . Statins Other (See Comments)    Increased CK levels  . Zetia [Ezetimibe] Other (See Comments)  Increased CK level and muscle cramps     Medications: Outpatient Medications Prior to Visit  Medication Sig  . Cholecalciferol (VITAMIN D3) 5000 UNITS CAPS Take 1 capsule by mouth daily.   . cyanocobalamin 1000 MCG tablet Take 2,000 mcg by mouth daily.  [name] Sodium (COLACE PO) Take by mouth daily.  Marland Kitchen esomeprazole (NEXIUM) 40 MG capsule Take 1 capsule (40 mg total) by mouth daily.  . fenofibrate (TRICOR) 145 MG tablet Take 1 tablet (145 mg total) by mouth daily.  Marland Kitchen lisinopril (ZESTRIL) 10 MG tablet Take 1 tablet (10  mg total) by mouth daily.  . pantoprazole (PROTONIX) 40 MG tablet TAKE 1 TABLET BY MOUTH DAILY.  Marland Kitchen Vitamin D, Ergocalciferol, (DRISDOL) 1.25 MG (50000 UNIT) CAPS capsule Take 1 capsule (50,000 Units total) by mouth every 7 (seven) days.   No facility-administered medications prior to visit.    Review of Systems  Constitutional: Negative.   Respiratory: Negative.   Cardiovascular: Negative.   Gastrointestinal: Negative.   Endocrine: Negative for polydipsia, polyphagia and polyuria.  Skin: Negative for wound.  Neurological: Negative for dizziness, light-headedness and headaches.      Objective    BP 113/75 (BP Location: Right Arm, Patient Position: Sitting, Cuff Size: Large)   Pulse 71   Temp 98.4 F (36.9 C) (Oral)   Wt 237 lb (107.5 kg)   BMI 32.14 kg/m    Physical Exam Vitals reviewed.  Constitutional:      General: She is not in acute distress.    Appearance: Normal appearance. She is well-developed and well-nourished. She is obese. She is not ill-appearing or diaphoretic.  HENT:     Head: Normocephalic and atraumatic.  Cardiovascular:     Rate and Rhythm: Normal rate and regular rhythm.     Heart sounds: Normal heart sounds. No murmur heard. No friction rub. No gallop.   Pulmonary:     Effort: Pulmonary effort is normal. No respiratory distress.     Breath sounds: Normal breath sounds. No wheezing or rales.  Musculoskeletal:     Cervical back: Normal range of motion and neck supple.  Neurological:     General: No focal deficit present.     Mental Status: She is alert. Mental status is at baseline.  Psychiatric:        Mood and Affect: Mood normal.        Thought Content: Thought content normal.      Results for orders placed or performed in visit on 11/15/20  POCT glycosylated hemoglobin (Hb A1C)  Result Value Ref Range   Hemoglobin A1C 6.2 (A) 4.0 - 5.6 %    Assessment & Plan     1. Controlled type 2 diabetes mellitus with stage 3 chronic kidney  disease, without long-term current use of insulin (HCC) A1c today stable at 6.2. Kidney function has continued to decline, followed by Nephrology. Will add Wilder Glade as below for possible renal benefits and glycemic control. Will check labs as below and f/u pending results. F/U in 3 months.  - POCT glycosylated hemoglobin (Hb A1C) - dapagliflozin propanediol (FARXIGA) 5 MG TABS tablet; Take 1 tablet (5 mg total) by mouth daily before breakfast. (Patient not taking: Reported on 11/15/2020)  Dispense: 90 tablet; Refill: 1 - Comprehensive Metabolic Panel (CMET)  2. Stage 3a chronic kidney disease (HCC) Slight decline at last check with Nephrology (available in care everywhere). Adding Wilder Glade as noted above. Will check labs as below and f/u pending results. - Comprehensive Metabolic Panel (CMET)  3. Fatty liver Noted to have early signs of fatty liver. Followed by GI, Dr. Allen Norris. Will check hepatitis titers as below and vaccinate as needed.  - Hepatitis C Antibody - Hepatitis A Ab, Total - Hepatitis B Surface AntiBODY - Comprehensive Metabolic Panel (CMET)  4. Drug-induced myopathy Statins. Causes muscle pains and increase in CK.    No follow-ups on file.      Reynolds Bowl, PA-C, have reviewed all documentation for this visit. The documentation on 11/15/20 for the exam, diagnosis, procedures, and orders are all accurate and complete.   Rubye Beach  [facility] [phone] (phone) [phone] (fax)  [place]

## 2020-11-15 NOTE — Patient Instructions (Signed)
Dapagliflozin tablets What is this medicine? DAPAGLIFLOZIN (DAP a gli FLOE zin) controls blood sugar in people with diabetes. It is used with lifestyle changes like diet and exercise. It also treats heart failure and kidney disease. It may lower the risk for treatment of heart failure in the hospital or worsened kidney disease. This medicine may be used for other purposes; ask your health care provider or pharmacist if you have questions. COMMON BRAND NAME(S): Wilder Glade What should I tell my health care provider before I take this medicine? They need to know if you have any of these conditions:  dehydration  diabetic ketoacidosis  diet low in salt  eating less due to illness, surgery, dieting, or any other reason  having surgery  history of pancreatitis or pancreas problems  history of yeast infection of the penis or vagina  if you often drink alcohol  infection in the bladder, kidneys, or urinary tract  kidney disease  low blood pressure  on dialysis  problems urinating  type 1 diabetes  uncircumcised female  an unusual or allergic reaction to dapagliflozin, other medicines, foods, dyes, or preservatives  pregnant or trying to get pregnant  breast-feeding How should I use this medicine? Take this medicine by mouth with water. Take it as directed on the prescription label at the same time every day. You can take it with or without food. If it upsets your stomach, take it with food. Keep taking it unless your health care provider tells you to stop. A special MedGuide will be given to you by the pharmacist with each prescription and refill. Be sure to read this information carefully each time. Talk to your health care provider about the use of this medicine in children. Special care may be needed. Overdosage: If you think you have taken too much of this medicine contact a poison control center or emergency room at once. NOTE: This medicine is only for you. Do not share this  medicine with others. What if I miss a dose? If you miss a dose, take it as soon as you can. If it is almost time for your next dose, take only that dose. Do not take double or extra doses. What may interact with this medicine? Interactions are not expected. This list may not describe all possible interactions. Give your health care provider a list of all the medicines, herbs, non-prescription drugs, or dietary supplements you use. Also tell them if you smoke, drink alcohol, or use illegal drugs. Some items may interact with your medicine. What should I watch for while using this medicine? Visit your health care provider for regular checks on your progress. Tell your health care provider if your symptoms do not start to get better or if they get worse. This medicine can cause a serious condition in which there is too much acid in the blood. If you develop nausea, vomiting, stomach pain, unusual tiredness, or breathing problems, stop taking this medicine and call your doctor right away. If possible, use a ketone dipstick to check for ketones in your urine. Check with your health care provider if you have severe diarrhea, nausea, and vomiting, or if you sweat a lot. The loss of too much body fluid may make it dangerous for you to take this medicine. A test called the HbA1C (A1C) will be monitored. This is a simple blood test. It measures your blood sugar control over the last 2 to 3 months. You will receive this test every 3 to 6 months. Learn how  to check your blood sugar. Learn the symptoms of low and high blood sugar and how to manage them. Always carry a quick-source of sugar with you in case you have symptoms of low blood sugar. Examples include hard sugar candy or glucose tablets. Make sure others know that you can choke if you eat or drink when you develop serious symptoms of low blood sugar, such as seizures or unconsciousness. Get medical help at once. Tell your health care provider if you have  high blood sugar. You might need to change the dose of your medicine. If you are sick or exercising more than usual, you may need to change the dose of your medicine. Do not skip meals. Ask your health care provider if you should avoid alcohol. Many nonprescription cough and cold products contain sugar or alcohol. These can affect blood sugar. Wear a medical ID bracelet or chain. Carry a card that describes your condition. List the medicines and doses you take on the card. What side effects may I notice from receiving this medicine? Side effects that you should report to your doctor or health care professional as soon as possible:  allergic reactions (skin rash, itching or hives, swelling of the face, lips, or tongue)  breathing problems  dizziness  feeling faint or lightheaded, falls  genital infection (fever; tenderness, redness, or swelling in the genitals or area from the genitals to the back of the rectum)  kidney injury (trouble passing urine or change in the amount of urine)  low blood sugar (feeling anxious; confusion; dizziness; increased hunger; unusually weak or tired; increased sweating; shakiness; cold, clammy skin; irritable; headache; blurred vision; fast heartbeat; loss of consciousness)  muscle weakness  nausea, vomiting, unusual stomach upset or pain  new pain or tenderness, change in skin color, sores or ulcers, or infection in legs or feet  penile discharge, itching, or pain  unusual tiredness  unusual vaginal discharge, itching, or odor  urinary tract infection (fever; chills; a burning feeling when urinating; urgent need to urinate more often; blood in the urine; back pain) Side effects that usually do not require medical attention (report to your doctor or health care professional if they continue or are bothersome):  mild increase in urination  thirsty This list may not describe all possible side effects. Call your doctor for medical advice about side  effects. You may report side effects to FDA at 1-800-FDA-1088. Where should I keep my medicine? Keep out of the reach of children and pets. Store at room temperature between 20 and 25 degrees C (68 and 77 degrees F). Get rid of any unused medicine after the expiration date. To get rid of medicines that are no longer needed or have expired:  Take the medicine to a medicine take-back program. Check with your pharmacy or law enforcement to find a location.  If you cannot return the medicine, check the label or package insert to see if the medicine should be thrown out in the garbage or flushed down the toilet. If you are not sure, ask your health care provider. If it is safe to put it in the trash, take the medicine out of the container. Mix the medicine with cat litter, dirt, coffee grounds, or other unwanted substance. Seal the mixture in a bag or container. Put it in the trash. NOTE: This sheet is a summary. It may not cover all possible information. If you have questions about this medicine, talk to your doctor, pharmacist, or health care provider.  2021  Elsevier/Gold Standard (2020-02-09 13:18:47)

## 2020-11-16 LAB — COMPREHENSIVE METABOLIC PANEL
ALT: 34 IU/L — ABNORMAL HIGH (ref 0–32)
AST: 25 IU/L (ref 0–40)
Albumin/Globulin Ratio: 1.7 (ref 1.2–2.2)
Albumin: 4.9 g/dL (ref 3.8–4.9)
Alkaline Phosphatase: 33 IU/L — ABNORMAL LOW (ref 44–121)
BUN/Creatinine Ratio: 11 (ref 9–23)
BUN: 13 mg/dL (ref 6–24)
Bilirubin Total: 0.3 mg/dL (ref 0.0–1.2)
CO2: 23 mmol/L (ref 20–29)
Calcium: 10.4 mg/dL — ABNORMAL HIGH (ref 8.7–10.2)
Chloride: 103 mmol/L (ref 96–106)
Creatinine, Ser: 1.16 mg/dL — ABNORMAL HIGH (ref 0.57–1.00)
Globulin, Total: 2.9 g/dL (ref 1.5–4.5)
Glucose: 95 mg/dL (ref 65–99)
Potassium: 4.4 mmol/L (ref 3.5–5.2)
Sodium: 142 mmol/L (ref 134–144)
Total Protein: 7.8 g/dL (ref 6.0–8.5)
eGFR: 55 mL/min/{1.73_m2} — ABNORMAL LOW (ref >59)

## 2020-11-16 LAB — HEPATITIS A ANTIBODY, TOTAL: hep A Total Ab: NEGATIVE

## 2020-11-16 LAB — HEPATITIS B SURFACE ANTIBODY,QUALITATIVE: Hep B Surface Ab, Qual: NONREACTIVE

## 2020-11-16 LAB — HEPATITIS C ANTIBODY: Hep C Virus Ab: <0.1 s/co ratio (ref 0.0–0.9)

## 2020-11-20 NOTE — Progress Notes (Signed)
Hepatitis C antibody negative.  Hepatitis A negative.  Hepatitis B negative.  Kidney function slightly more decreased than 2 months ago, avoid NSAID's such as ibuprofen, aleve, motrin as well as be sure to stay hydrated.  Calcium still slightly elevated, 10.4. Add on PTH for recheck and verify if she is taking any cacium supplements if she is she should decrease these. Liver function is stable.   Recheck CMP, CBC in 6 weeks. Keep scheduled follow up.

## 2020-12-20 ENCOUNTER — Other Ambulatory Visit: Payer: Self-pay

## 2020-12-20 MED FILL — Fenofibrate Tab 145 MG: ORAL | 90 days supply | Qty: 90 | Fill #0 | Status: AC

## 2020-12-28 ENCOUNTER — Other Ambulatory Visit: Payer: Self-pay

## 2020-12-28 DIAGNOSIS — K5900 Constipation, unspecified: Secondary | ICD-10-CM | POA: Diagnosis not present

## 2020-12-28 DIAGNOSIS — N182 Chronic kidney disease, stage 2 (mild): Secondary | ICD-10-CM | POA: Diagnosis not present

## 2020-12-28 DIAGNOSIS — K5909 Other constipation: Secondary | ICD-10-CM | POA: Diagnosis not present

## 2020-12-28 DIAGNOSIS — Z780 Asymptomatic menopausal state: Secondary | ICD-10-CM | POA: Diagnosis not present

## 2020-12-28 DIAGNOSIS — E119 Type 2 diabetes mellitus without complications: Secondary | ICD-10-CM | POA: Diagnosis not present

## 2020-12-28 MED ORDER — LACTULOSE 10 GM/15ML PO SOLN
ORAL | 1 refills | Status: AC
  Filled 2020-12-28: qty 300, 10d supply, fill #0

## 2020-12-28 MED ORDER — LUBIPROSTONE 8 MCG PO CAPS
ORAL_CAPSULE | ORAL | 0 refills | Status: DC
  Filled 2020-12-28 – 2021-01-08 (×4): qty 60, 30d supply, fill #0

## 2020-12-29 ENCOUNTER — Other Ambulatory Visit: Payer: Self-pay

## 2021-01-01 ENCOUNTER — Other Ambulatory Visit: Payer: Self-pay

## 2021-01-02 ENCOUNTER — Other Ambulatory Visit: Payer: Self-pay

## 2021-01-04 ENCOUNTER — Other Ambulatory Visit: Payer: Self-pay

## 2021-01-08 ENCOUNTER — Other Ambulatory Visit: Payer: Self-pay

## 2021-01-09 ENCOUNTER — Other Ambulatory Visit: Payer: Self-pay

## 2021-01-10 ENCOUNTER — Other Ambulatory Visit: Payer: Self-pay

## 2021-01-12 ENCOUNTER — Other Ambulatory Visit: Payer: Self-pay

## 2021-01-12 ENCOUNTER — Telehealth: Payer: Self-pay

## 2021-01-12 DIAGNOSIS — K219 Gastro-esophageal reflux disease without esophagitis: Secondary | ICD-10-CM

## 2021-01-12 DIAGNOSIS — R1310 Dysphagia, unspecified: Secondary | ICD-10-CM

## 2021-01-12 DIAGNOSIS — R12 Heartburn: Secondary | ICD-10-CM

## 2021-01-12 DIAGNOSIS — Z86010 Personal history of colonic polyps: Secondary | ICD-10-CM

## 2021-01-12 NOTE — Telephone Encounter (Signed)
Patient wants Ginger to know that she is now ready to schedule her colonoscopy?

## 2021-01-12 NOTE — Telephone Encounter (Signed)
Return pt's call and scheduled colon/EGD with Dr. Allen Norris at [facility] on 02/23/21

## 2021-01-17 ENCOUNTER — Telehealth: Payer: Self-pay

## 2021-01-17 NOTE — Telephone Encounter (Signed)
Needs to reschedule procedure to Mon June 13th/[place], Has spoken to Dr. Allen Norris and he verified the opening. Please mychart the patient once the appt date is updated.

## 2021-01-18 NOTE — Telephone Encounter (Signed)
Pt has been rescheduled to June 13th per pt request.

## 2021-01-24 ENCOUNTER — Other Ambulatory Visit: Payer: Self-pay

## 2021-01-24 DIAGNOSIS — K5909 Other constipation: Secondary | ICD-10-CM | POA: Diagnosis not present

## 2021-01-24 DIAGNOSIS — N182 Chronic kidney disease, stage 2 (mild): Secondary | ICD-10-CM | POA: Diagnosis not present

## 2021-01-24 DIAGNOSIS — E119 Type 2 diabetes mellitus without complications: Secondary | ICD-10-CM | POA: Diagnosis not present

## 2021-01-24 MED ORDER — LUBIPROSTONE 8 MCG PO CAPS
ORAL_CAPSULE | ORAL | 1 refills | Status: DC
  Filled 2021-01-24: qty 180, 90d supply, fill #0
  Filled 2021-04-30: qty 180, 90d supply, fill #1

## 2021-01-24 MED ORDER — FARXIGA 5 MG PO TABS
ORAL_TABLET | ORAL | 1 refills | Status: AC
  Filled 2021-01-24: qty 90, 90d supply, fill #0
  Filled 2021-04-30: qty 90, 90d supply, fill #1

## 2021-01-29 ENCOUNTER — Other Ambulatory Visit: Payer: Self-pay

## 2021-01-31 ENCOUNTER — Other Ambulatory Visit: Payer: Self-pay

## 2021-01-31 MED FILL — Pantoprazole Sodium EC Tab 40 MG (Base Equiv): ORAL | 90 days supply | Qty: 90 | Fill #0 | Status: AC

## 2021-02-01 ENCOUNTER — Other Ambulatory Visit: Payer: Self-pay

## 2021-02-05 ENCOUNTER — Other Ambulatory Visit: Payer: Self-pay | Admitting: Gastroenterology

## 2021-02-05 ENCOUNTER — Other Ambulatory Visit: Payer: Self-pay

## 2021-02-05 MED ORDER — PEG 3350-KCL-NA BICARB-NACL 420 G PO SOLR
4000.0000 mL | ORAL | 0 refills | Status: AC
  Filled 2021-02-05: qty 4000, 1d supply, fill #0

## 2021-02-06 ENCOUNTER — Other Ambulatory Visit: Payer: Self-pay

## 2021-02-07 ENCOUNTER — Other Ambulatory Visit: Payer: Self-pay

## 2021-02-07 MED FILL — Ergocalciferol Cap 1.25 MG (50000 Unit): ORAL | 84 days supply | Qty: 12 | Fill #0 | Status: AC

## 2021-02-13 ENCOUNTER — Encounter: Payer: Self-pay | Admitting: Gastroenterology

## 2021-02-19 ENCOUNTER — Ambulatory Visit: Payer: 59 | Admitting: Family Medicine

## 2021-02-26 ENCOUNTER — Encounter
Admission: RE | Disposition: A | Discharge status: Home / Self Care | Payer: Self-pay | Source: Home / Self Care | Attending: Gastroenterology

## 2021-02-26 ENCOUNTER — Encounter: Payer: Self-pay | Admitting: Gastroenterology

## 2021-02-26 ENCOUNTER — Ambulatory Visit: Payer: 59 | Admitting: Anesthesiology

## 2021-02-26 ENCOUNTER — Ambulatory Visit
Admission: RE | Admit: 2021-02-26 | Discharge: 2021-02-26 | Disposition: A | Discharge status: Home / Self Care | Payer: 59 | Attending: Gastroenterology | Admitting: Gastroenterology

## 2021-02-26 ENCOUNTER — Other Ambulatory Visit: Payer: Self-pay

## 2021-02-26 DIAGNOSIS — Z8249 Family history of ischemic heart disease and other diseases of the circulatory system: Secondary | ICD-10-CM | POA: Diagnosis not present

## 2021-02-26 DIAGNOSIS — Z888 Allergy status to other drugs, medicaments and biological substances status: Secondary | ICD-10-CM | POA: Diagnosis not present

## 2021-02-26 DIAGNOSIS — K317 Polyp of stomach and duodenum: Secondary | ICD-10-CM | POA: Diagnosis not present

## 2021-02-26 DIAGNOSIS — Z833 Family history of diabetes mellitus: Secondary | ICD-10-CM | POA: Insufficient documentation

## 2021-02-26 DIAGNOSIS — K641 Second degree hemorrhoids: Secondary | ICD-10-CM | POA: Diagnosis not present

## 2021-02-26 DIAGNOSIS — R12 Heartburn: Secondary | ICD-10-CM

## 2021-02-26 DIAGNOSIS — Z1211 Encounter for screening for malignant neoplasm of colon: Secondary | ICD-10-CM | POA: Insufficient documentation

## 2021-02-26 DIAGNOSIS — Z823 Family history of stroke: Secondary | ICD-10-CM | POA: Insufficient documentation

## 2021-02-26 DIAGNOSIS — K219 Gastro-esophageal reflux disease without esophagitis: Secondary | ICD-10-CM | POA: Diagnosis not present

## 2021-02-26 DIAGNOSIS — K449 Diaphragmatic hernia without obstruction or gangrene: Secondary | ICD-10-CM | POA: Insufficient documentation

## 2021-02-26 DIAGNOSIS — Z86010 Personal history of colonic polyps: Secondary | ICD-10-CM | POA: Diagnosis not present

## 2021-02-26 DIAGNOSIS — D124 Benign neoplasm of descending colon: Secondary | ICD-10-CM | POA: Insufficient documentation

## 2021-02-26 DIAGNOSIS — K635 Polyp of colon: Secondary | ICD-10-CM | POA: Diagnosis not present

## 2021-02-26 DIAGNOSIS — Z7984 Long term (current) use of oral hypoglycemic drugs: Secondary | ICD-10-CM | POA: Diagnosis not present

## 2021-02-26 DIAGNOSIS — Z803 Family history of malignant neoplasm of breast: Secondary | ICD-10-CM | POA: Diagnosis not present

## 2021-02-26 DIAGNOSIS — R1310 Dysphagia, unspecified: Secondary | ICD-10-CM

## 2021-02-26 DIAGNOSIS — Z79899 Other long term (current) drug therapy: Secondary | ICD-10-CM | POA: Insufficient documentation

## 2021-02-26 DIAGNOSIS — E119 Type 2 diabetes mellitus without complications: Secondary | ICD-10-CM | POA: Insufficient documentation

## 2021-02-26 DIAGNOSIS — K3189 Other diseases of stomach and duodenum: Secondary | ICD-10-CM | POA: Insufficient documentation

## 2021-02-26 DIAGNOSIS — Z8349 Family history of other endocrine, nutritional and metabolic diseases: Secondary | ICD-10-CM | POA: Diagnosis not present

## 2021-02-26 HISTORY — PX: COLONOSCOPY WITH PROPOFOL: SHX5780

## 2021-02-26 HISTORY — PX: POLYPECTOMY: SHX5525

## 2021-02-26 HISTORY — PX: ESOPHAGOGASTRODUODENOSCOPY (EGD) WITH PROPOFOL: SHX5813

## 2021-02-26 LAB — GLUCOSE, CAPILLARY: Glucose-Capillary: 81 mg/dL (ref 70–99)

## 2021-02-26 SURGERY — COLONOSCOPY WITH PROPOFOL
Anesthesia: General | Site: Rectum

## 2021-02-26 MED ORDER — ONDANSETRON HCL 4 MG/2ML IJ SOLN: 4.0000 mg | Freq: Once | INTRAMUSCULAR | Status: DC | PRN

## 2021-02-26 MED ORDER — STERILE WATER FOR IRRIGATION IR SOLN
Status: DC | PRN
  Administered 2021-02-26: 150 mL

## 2021-02-26 MED ORDER — ACETAMINOPHEN 160 MG/5ML PO SOLN
325.0000 mg | ORAL | Status: DC | PRN
Start: 2021-02-26 — End: 2021-02-26

## 2021-02-26 MED ORDER — LACTATED RINGERS IV SOLN: INTRAVENOUS | Status: DC

## 2021-02-26 MED ORDER — PROPOFOL 10 MG/ML IV BOLUS
INTRAVENOUS | Status: DC | PRN
  Administered 2021-02-26 (×3): 40 mg via INTRAVENOUS
  Administered 2021-02-26: 50 mg via INTRAVENOUS
  Administered 2021-02-26: 100 mg via INTRAVENOUS
  Administered 2021-02-26 (×4): 40 mg via INTRAVENOUS
  Administered 2021-02-26: 50 mg via INTRAVENOUS
  Administered 2021-02-26 (×3): 40 mg via INTRAVENOUS
  Administered 2021-02-26: 50 mg via INTRAVENOUS

## 2021-02-26 MED ORDER — GLYCOPYRROLATE 0.2 MG/ML IJ SOLN
INTRAMUSCULAR | Status: DC | PRN
  Administered 2021-02-26: .1 mg via INTRAVENOUS

## 2021-02-26 MED ORDER — EPINEPHRINE 1 MG/10ML IJ SOSY
PREFILLED_SYRINGE | INTRAMUSCULAR | Status: DC | PRN
  Administered 2021-02-26: 0.3 mg via SUBCUTANEOUS

## 2021-02-26 MED ORDER — LIDOCAINE HCL (CARDIAC) PF 100 MG/5ML IV SOSY
PREFILLED_SYRINGE | INTRAVENOUS | Status: DC | PRN
  Administered 2021-02-26: 30 mg via INTRAVENOUS

## 2021-02-26 MED ORDER — SODIUM CHLORIDE 0.9 % IV SOLN: INTRAVENOUS | Status: DC

## 2021-02-26 MED ORDER — ACETAMINOPHEN 325 MG PO TABS: 325.0000 mg | ORAL_TABLET | ORAL | Status: DC | PRN

## 2021-02-26 SURGICAL SUPPLY — 38 items
BALLN DILATOR 10-12 8 (BALLOONS)
BALLN DILATOR 12-15 8 (BALLOONS)
BALLN DILATOR 15-18 8 (BALLOONS) ×3
BALLN DILATOR CRE 0-12 8 (BALLOONS)
BALLN DILATOR ESOPH 8 10 CRE (MISCELLANEOUS) IMPLANT
BALLOON DILATOR 12-15 8 (BALLOONS) IMPLANT
BALLOON DILATOR 15-18 8 (BALLOONS) ×2 IMPLANT
BALLOON DILATOR CRE 0-12 8 (BALLOONS) IMPLANT
BLOCK BITE 60FR ADLT L/F GRN (MISCELLANEOUS) ×3 IMPLANT
CLIP HMST 235XBRD CATH ROT (MISCELLANEOUS) ×2 IMPLANT
CLIP RESOLUTION 360 11X235 (MISCELLANEOUS) ×3
ELECT REM PT RETURN 9FT ADLT (ELECTROSURGICAL)
ELECTRODE REM PT RTRN 9FT ADLT (ELECTROSURGICAL) IMPLANT
FCP ESCP3.2XJMB 240X2.8X (MISCELLANEOUS) ×2
FORCEPS BIOP RAD 4 LRG CAP 4 (CUTTING FORCEPS) IMPLANT
FORCEPS BIOP RJ4 240 W/NDL (MISCELLANEOUS) ×3
FORCEPS ESCP3.2XJMB 240X2.8X (MISCELLANEOUS) ×2 IMPLANT
GOWN CVR UNV OPN BCK APRN NK (MISCELLANEOUS) ×4 IMPLANT
GOWN ISOL THUMB LOOP REG UNIV (MISCELLANEOUS) ×6
INJECTOR VARIJECT VIN23 (MISCELLANEOUS) ×2 IMPLANT
KIT DEFENDO VALVE AND CONN (KITS) IMPLANT
KIT PRC NS LF DISP ENDO (KITS) ×2 IMPLANT
KIT PROCEDURE OLYMPUS (KITS) ×3
MANIFOLD NEPTUNE II (INSTRUMENTS) ×3 IMPLANT
MARKER SPOT ENDO TATTOO 5ML (MISCELLANEOUS) IMPLANT
PROBE APC STR FIRE (PROBE) IMPLANT
RETRIEVER NET PLAT FOOD (MISCELLANEOUS) IMPLANT
RETRIEVER NET ROTH 2.5X230 LF (MISCELLANEOUS) ×3 IMPLANT
SNARE COLD EXACTO (MISCELLANEOUS) IMPLANT
SNARE SHORT THROW 13M SML OVAL (MISCELLANEOUS) ×3 IMPLANT
SNARE SHORT THROW 30M LRG OVAL (MISCELLANEOUS) IMPLANT
SNARE SNG USE RND 15MM (INSTRUMENTS) IMPLANT
SPOT EX ENDOSCOPIC TATTOO (MISCELLANEOUS)
SYR INFLATION 60ML (SYRINGE) ×3 IMPLANT
TRAP ETRAP POLY (MISCELLANEOUS) ×3 IMPLANT
VARIJECT INJECTOR VIN23 (MISCELLANEOUS) ×3
WATER STERILE IRR 250ML POUR (IV SOLUTION) ×3 IMPLANT
WIRE CRE 18-20MM 8CM F G (MISCELLANEOUS) IMPLANT

## 2021-02-26 NOTE — Anesthesia Postprocedure Evaluation (Signed)
Anesthesia Post Note  Patient: Alexandra Mora  Procedure(s) Performed: COLONOSCOPY WITH PROPOFOL (Rectum) ESOPHAGOGASTRODUODENOSCOPY (EGD) WITH PROPOFOL (Esophagus) POLYPECTOMY (Esophagus)     Patient location during evaluation: PACU Anesthesia Type: General Level of consciousness: awake Pain management: pain level controlled Vital Signs Assessment: post-procedure vital signs reviewed and stable Respiratory status: respiratory function stable Cardiovascular status: stable Postop Assessment: no signs of nausea or vomiting Anesthetic complications: no   No notable events documented.  Veda Canning

## 2021-02-26 NOTE — H&P (Addendum)
Lucilla Lame, MD [address] [place], [place] [address] Phone:[phone] Fax : [phone]  Primary Care Physician:  Gladstone Lighter, MD Primary Gastroenterologist:  Dr. Allen Norris  Pre-Procedure History & Physical: HPI:  Alexandra Mora is a 60 y.o. female is here for an colonoscopy and EGD.   Past Medical History:  Diagnosis Date   Arthritis    lower back   Benign paroxysmal positional vertigo    Diabetes mellitus without complication (HCC)    Elevated CK    GERD (gastroesophageal reflux disease)    Hypercholesteremia    Hyperglycemia    Hypertension    Insomnia    Menopausal disorder     Past Surgical History:  Procedure Laterality Date   ABDOMINAL HYSTERECTOMY     COLONOSCOPY WITH PROPOFOL N/A 06/13/2017   Procedure: COLONOSCOPY WITH PROPOFOL;  Surgeon: Lucilla Lame, MD;  Location: [facility];  Service: Gastroenterology;  Laterality: N/A;   LAPAROSCOPIC CHOLECYSTECTOMY     POLYPECTOMY  06/13/2017   Procedure: POLYPECTOMY;  Surgeon: Lucilla Lame, MD;  Location: [facility];  Service: Gastroenterology;;   TOTAL ABDOMINAL HYSTERECTOMY W/ BILATERAL SALPINGOOPHORECTOMY      Prior to Admission medications   Medication Sig Start Date End Date Taking? Authorizing Provider  Cholecalciferol (VITAMIN D3) 5000 UNITS CAPS Take 1 capsule by mouth daily.    Yes [provider]  cyanocobalamin 1000 MCG tablet Take 2,000 mcg by mouth daily.   Yes [provider]  dapagliflozin propanediol (FARXIGA) 5 MG TABS tablet TAKE 1 TABLET (5 MG TOTAL) BY MOUTH DAILY BEFORE BREAKFAST. 11/15/20 11/15/21 Yes Burnette, Clearnce Sorrel, PA-C  dapagliflozin propanediol (FARXIGA) 5 MG TABS tablet Take 1 tablet (5 mg total) by mouth once daily 01/24/21  Yes   Docusate Sodium (COLACE PO) Take by mouth daily.   Yes [provider]  fenofibrate (TRICOR) 145 MG tablet TAKE 1 TABLET (145 MG TOTAL) BY MOUTH DAILY. 08/04/20 08/04/21 Yes Burnette, Clearnce Sorrel, PA-C   lisinopril (ZESTRIL) 10 MG tablet TAKE 1 TABLET (10 MG TOTAL) BY MOUTH DAILY. 08/04/20 08/04/21 Yes Burnette, Clearnce Sorrel, PA-C  lubiprostone (AMITIZA) 8 MCG capsule Take 1 capsule (8 mcg total) by mouth 2 (two) times daily with meals for 30 days 01/24/21  Yes   pantoprazole (PROTONIX) 40 MG tablet TAKE 1 TABLET BY MOUTH DAILY. 06/26/20 06/26/21 Yes Joby Richart, MD  polyethylene glycol-electrolytes (NULYTELY WITH FLAVOR PACKS) 420 g solution Take 4,000 mLs by mouth as directed. 02/05/21  Yes Lucilla Lame, MD  Vitamin D, Ergocalciferol, (DRISDOL) 1.25 MG (50000 UNIT) CAPS capsule TAKE 1 CAPSULE (50,000 UNITS TOTAL) BY MOUTH EVERY 7 (SEVEN) DAYS. 08/04/20 08/04/21 Yes Burnette, Clearnce Sorrel, PA-C  esomeprazole (NEXIUM) 40 MG capsule TAKE 1 CAPSULE BY MOUTH DAILY. Patient not taking: Reported on 02/13/2021 11/07/20 11/07/21  Lucilla Lame, MD  etodolac (LODINE) 500 MG tablet TAKE 1 TABLET BY MOUTH TWO TIMES DAILY Patient not taking: Reported on 02/13/2021 08/29/20 08/29/21  Samara Deist, DPM  lactulose [facility]) 10 GM/15ML solution Take 15 mLs by mouth once daily as needed for Constipation for up to 10 days Patient not taking: Reported on 02/13/2021 12/28/20     zaleplon (SONATA) 10 MG capsule Take 1 capsule (10 mg total) by mouth at bedtime as needed for sleep. 08/04/20 11/07/20  Mar Daring, PA-C    Allergies as of 01/12/2021 - Review Complete 11/15/2020  Allergen Reaction Noted   Dexilant [dexlansoprazole]  01/31/2014   Statins Other (See Comments) 01/27/2017   Zetia [  ezetimibe] Other (See Comments) 03/13/2020    Family History  Problem Relation Age of Onset   Heart disease Mother    Heart attack Mother 36   Hyperlipidemia Mother    Hypertension Mother    Stroke Mother    Hypertension Father    Hypertension Sister    Hypertension Brother    Hypertension Sister    Hypertension Sister    Colon cancer Other    Diabetes Other    Breast cancer Maternal Aunt    Breast cancer Maternal  Aunt     Social History   Socioeconomic History   Marital status: Single    Spouse name: Not on file   Number of children: 1   Years of education: Not on file   Highest education level: Not on file  Occupational History   Not on file  Tobacco Use   Smoking status: Never   Smokeless tobacco: Never  Vaping Use   Vaping Use: Not on file  Substance and Sexual Activity   Alcohol use: No   Drug use: No   Sexual activity: Never  Other Topics Concern   Not on file  Social History Narrative   Not on file   Social Determinants of Health   Financial Resource Strain: Not on file  Food Insecurity: Not on file  Transportation Needs: Not on file  Physical Activity: Not on file  Stress: Not on file  Social Connections: Not on file  Intimate Partner Violence: Not on file    Review of Systems: See HPI, otherwise negative ROS  Physical Exam: BP 109/67   Pulse 85   Temp 98.2 F (36.8 C) (Tympanic)   Resp 20   Ht 6' (1.829 m)   Wt 103.4 kg   SpO2 100%   BMI 30.92 kg/m  General:   Alert,  pleasant and cooperative in NAD Head:  Normocephalic and atraumatic. Neck:  Supple; no masses or thyromegaly. Lungs:  Clear throughout to auscultation.    Heart:  Regular rate and rhythm. Abdomen:  Soft, nontender and nondistended. Normal bowel sounds, without guarding, and without rebound.   Neurologic:  Alert and  oriented x4;  grossly normal neurologically.  Impression/Plan: Alexandra Mora is here for an endoscopy and colonoscopy to be performed for history of adenomatous polyps in 2018 and GERD  Risks, benefits, limitations, and alternatives regarding  endoscopy and colonoscopy have been reviewed with the patient.  Questions have been answered.  All parties agreeable.   Lucilla Lame, MD  02/26/2021, 8:08 AM

## 2021-02-26 NOTE — Anesthesia Procedure Notes (Signed)
Date/Time: 02/26/2021 8:44 AM Performed by: Cameron Ali, CRNA Pre-anesthesia Checklist: Patient identified, Emergency Drugs available, Suction available, Timeout performed and Patient being monitored Patient Re-evaluated:Patient Re-evaluated prior to induction Oxygen Delivery Method: Nasal cannula Placement Confirmation: positive ETCO2

## 2021-02-26 NOTE — Anesthesia Preprocedure Evaluation (Signed)
Anesthesia Evaluation  Patient identified by MRN, date of birth, ID band Patient awake    Reviewed: Allergy & Precautions, NPO status   Airway Mallampati: II  TM Distance: >3 FB     Dental   Pulmonary    Pulmonary exam normal        Cardiovascular hypertension,  Rhythm:Regular Rate:Normal     Neuro/Psych PSYCHIATRIC DISORDERS Anxiety    GI/Hepatic GERD  ,  Endo/Other  diabetesBMI 31  Renal/GU Renal InsufficiencyRenal disease     Musculoskeletal  (+) Arthritis ,   Abdominal   Peds  Hematology   Anesthesia Other Findings   Reproductive/Obstetrics                             Anesthesia Physical Anesthesia Plan  ASA: 3  Anesthesia Plan: General   Post-op Pain Management:    Induction: Intravenous  PONV Risk Score and Plan: Propofol infusion, TIVA and Treatment may vary due to age or medical condition  Airway Management Planned: Natural Airway and Nasal Cannula  Additional Equipment:   Intra-op Plan:   Post-operative Plan:   Informed Consent: I have reviewed the patients History and Physical, chart, labs and discussed the procedure including the risks, benefits and alternatives for the proposed anesthesia with the patient or authorized representative who has indicated his/her understanding and acceptance.       Plan Discussed with: CRNA  Anesthesia Plan Comments:         Anesthesia Quick Evaluation

## 2021-02-26 NOTE — Transfer of Care (Signed)
Immediate Anesthesia Transfer of Care Note  Patient: Alexandra Mora  Procedure(s) Performed: COLONOSCOPY WITH PROPOFOL (Rectum) ESOPHAGOGASTRODUODENOSCOPY (EGD) WITH PROPOFOL (Esophagus) POLYPECTOMY (Esophagus)  Patient Location: PACU  Anesthesia Type: General  Level of Consciousness: awake, alert  and patient cooperative  Airway and Oxygen Therapy: Patient Spontanous Breathing and Patient connected to supplemental oxygen  Post-op Assessment: Post-op Vital signs reviewed, Patient's Cardiovascular Status Stable, Respiratory Function Stable, Patent Airway and No signs of Nausea or vomiting  Post-op Vital Signs: Reviewed and stable  Complications: No notable events documented.

## 2021-02-26 NOTE — Op Note (Addendum)
[facility] Gastroenterology Patient Name: Alexandra Mora Procedure Date: 02/26/2021 8:42 AM MRN: [id number] Account #: [id number] Date of Birth: 1961-07-05 Admit Type: Outpatient Age: 60 Room: [facility] OR ROOM 01 Gender: Female Note Status: Finalized Procedure:             Colonoscopy Indications:           High risk colon cancer surveillance: Personal history                         of colonic polyps Providers:             Lucilla Lame MD, MD Referring MD:          Gladstone Lighter, MD (Referring MD) Medicines:             Propofol per Anesthesia Complications:         No immediate complications. Procedure:             Pre-Anesthesia Assessment:                        - Prior to the procedure, a History and Physical was                         performed, and patient medications and allergies were                         reviewed. The patient's tolerance of previous                         anesthesia was also reviewed. The risks and benefits                         of the procedure and the sedation options and risks                         were discussed with the patient. All questions were                         answered, and informed consent was obtained. Prior                         Anticoagulants: The patient has taken no previous                         anticoagulant or antiplatelet agents. ASA Grade                         Assessment: II - A patient with mild systemic disease.                         After reviewing the risks and benefits, the patient                         was deemed in satisfactory condition to undergo the                         procedure.  After obtaining informed consent, the colonoscope was                         passed under direct vision. Throughout the procedure,                         the patient's blood pressure, pulse, and oxygen                         saturations were monitored continuously. The was                          introduced through the anus and advanced to the the                         cecum, identified by appendiceal orifice and ileocecal                         valve. The colonoscopy was performed without                         difficulty. The patient tolerated the procedure well.                         The quality of the bowel preparation was good. Findings:      The perianal and digital rectal examinations were normal.      A 2 mm polyp was found in the descending colon. The polyp was sessile.       The polyp was removed with a cold biopsy forceps. Resection and       retrieval were complete.      Non-bleeding internal hemorrhoids were found during retroflexion. The       hemorrhoids were Grade II (internal hemorrhoids that prolapse but reduce       spontaneously). Impression:            - One 2 mm polyp in the descending colon, removed with                         a cold biopsy forceps. Resected and retrieved.                        - Non-bleeding internal hemorrhoids. Recommendation:        - Discharge patient to home.                        - Resume previous diet.                        - Continue present medications.                        - Await pathology results.                        - Repeat colonoscopy in 7 years for surveillance. Procedure Code(s):     --- Professional ---                        [phone], Colonoscopy, flexible; with biopsy, single or  multiple Diagnosis Code(s):     --- Professional ---                        Z86.010, Personal history of colonic polyps                        K63.5, Polyp of colon CPT copyright 2019 [employer]. All rights reserved. The codes documented in this report are preliminary and upon coder review may  be revised to meet current compliance requirements. Lucilla Lame MD, MD 02/26/2021 9:42:32 AM This report has been signed electronically. Number of Addenda: 0 Note Initiated On:  02/26/2021 8:42 AM Scope Withdrawal Time: 0 hours 8 minutes 40 seconds  Total Procedure Duration: 0 hours 17 minutes 27 seconds  Estimated Blood Loss:  Estimated blood loss: none.      [facility]

## 2021-02-26 NOTE — Op Note (Signed)
[facility] Gastroenterology Patient Name: Alexandra Mora Procedure Date: 02/26/2021 8:41 AM MRN: [id number] Account #: [id number] Date of Birth: 07/06/1961 Admit Type: Outpatient Age: 60 Room: [facility] OR ROOM 01 Gender: Female Note Status: Finalized Procedure:             Upper GI endoscopy Indications:           Dysphagia, Heartburn Providers:             Lucilla Lame MD, MD Referring MD:          Gladstone Lighter, MD (Referring MD) Medicines:             Propofol per Anesthesia Complications:         No immediate complications. Procedure:             Pre-Anesthesia Assessment:                        - Prior to the procedure, a History and Physical was                         performed, and patient medications and allergies were                         reviewed. The patient's tolerance of previous                         anesthesia was also reviewed. The risks and benefits                         of the procedure and the sedation options and risks                         were discussed with the patient. All questions were                         answered, and informed consent was obtained. Prior                         Anticoagulants: The patient has taken no previous                         anticoagulant or antiplatelet agents. ASA Grade                         Assessment: II - A patient with mild systemic disease.                         After reviewing the risks and benefits, the patient                         was deemed in satisfactory condition to undergo the                         procedure.                        After obtaining informed consent, the endoscope was  passed under direct vision. Throughout the procedure,                         the patient's blood pressure, pulse, and oxygen                         saturations were monitored continuously. The was                         introduced through the mouth, and advanced to the                          second part of duodenum. The upper GI endoscopy was                         accomplished without difficulty. The patient tolerated                         the procedure well. Findings:      A small hiatal hernia was present.      Biopsies were obtained with cold forceps for histology in the middle       third of the esophagus. A TTS dilator was passed through the scope.       Dilation with a 15-16.5-18 mm balloon dilator was performed to 18 mm in       the entire esophagus.      A single 10 mm pedunculated polyp with no bleeding and no stigmata of       recent bleeding was found in the gastric body. Area was successfully       injected with 3 mL of a 1:10,000 solution of epinephrine for hemostasis.       The polyp was removed with a hot snare. Resection and retrieval were       complete. To prevent bleeding post-intervention, one hemostatic clip was       successfully placed (MR conditional). There was no bleeding at the end       of the procedure.      Extrinsic compression on the stomach was found at the incisura.      The examined duodenum was normal. Impression:            - Small hiatal hernia.                        - A single gastric polyp. Resected and retrieved.                         Injected. Clip (MR conditional) was placed.                        - Extrinsic compression in the incisura.                        - Normal examined duodenum.                        - Biopsy performed in the middle third of the                         esophagus.                        -  Dilation performed in the entire esophagus. Recommendation:        - Discharge patient to home.                        - Resume previous diet.                        - Continue present medications.                        - Perform a colonoscopy today.                        - Perform an upper endoscopic ultrasound (UEUS). Procedure Code(s):     --- Professional ---                         [phone], Esophagogastroduodenoscopy, flexible,                         transoral; with removal of tumor(s), polyp(s), or                         other lesion(s) by snare technique                        43249, Esophagogastroduodenoscopy, flexible,                         transoral; with transendoscopic balloon dilation of                         esophagus (less than 30 mm diameter)                        43239, 59, Esophagogastroduodenoscopy, flexible,                         transoral; with biopsy, single or multiple Diagnosis Code(s):     --- Professional ---                        R13.10, Dysphagia, unspecified                        R12, Heartburn                        K31.89, Other diseases of stomach and duodenum                        K31.7, Polyp of stomach and duodenum CPT copyright 2019 American Medical Association. All rights reserved. The codes documented in this report are preliminary and upon coder review may  be revised to meet current compliance requirements. Lucilla Lame MD, MD 02/26/2021 9:43:01 AM This report has been signed electronically. Number of Addenda: 0 Note Initiated On: 02/26/2021 8:41 AM Estimated Blood Loss:  Estimated blood loss: none.      [facility]

## 2021-02-27 ENCOUNTER — Encounter: Payer: Self-pay | Admitting: Gastroenterology

## 2021-03-01 LAB — SURGICAL PATHOLOGY

## 2021-03-08 DIAGNOSIS — I10 Essential (primary) hypertension: Secondary | ICD-10-CM | POA: Diagnosis not present

## 2021-03-08 DIAGNOSIS — E1122 Type 2 diabetes mellitus with diabetic chronic kidney disease: Secondary | ICD-10-CM | POA: Diagnosis not present

## 2021-03-08 DIAGNOSIS — N1831 Chronic kidney disease, stage 3a: Secondary | ICD-10-CM | POA: Diagnosis not present

## 2021-03-12 ENCOUNTER — Telehealth: Payer: Self-pay

## 2021-03-12 ENCOUNTER — Other Ambulatory Visit: Payer: Self-pay

## 2021-03-12 DIAGNOSIS — Q858 Other phakomatoses, not elsewhere classified: Secondary | ICD-10-CM

## 2021-03-12 DIAGNOSIS — Q8589 Other phakomatoses, not elsewhere classified: Secondary | ICD-10-CM

## 2021-03-12 NOTE — Telephone Encounter (Signed)
Received request for EUS. Sent to [facility] for review prior to scheduling.

## 2021-03-12 NOTE — Telephone Encounter (Signed)
Pt aware of results.   Referral sent to genetic testing and [place] for [place].

## 2021-03-12 NOTE — Telephone Encounter (Signed)
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  She also needs to be set up for a endoscopic ultrasound for a submucosal nodule found on her upper endoscopy.

## 2021-03-16 DIAGNOSIS — H524 Presbyopia: Secondary | ICD-10-CM | POA: Diagnosis not present

## 2021-03-18 MED FILL — Lisinopril Tab 10 MG: ORAL | 90 days supply | Qty: 90 | Fill #0 | Status: AC

## 2021-03-18 MED FILL — Fenofibrate Tab 145 MG: ORAL | 90 days supply | Qty: 90 | Fill #1 | Status: AC

## 2021-03-20 ENCOUNTER — Other Ambulatory Visit: Payer: Self-pay

## 2021-03-26 ENCOUNTER — Telehealth: Payer: Self-pay

## 2021-03-26 ENCOUNTER — Other Ambulatory Visit: Payer: Self-pay

## 2021-03-26 NOTE — Telephone Encounter (Signed)
EUS has been scheduled for 7/21 with Dr. Francella Solian at [place]. Instructions sent to My Chart and can be located under letters. Notified [name] to call me with any questions regarding instructions.

## 2021-04-04 ENCOUNTER — Encounter: Payer: Self-pay | Admitting: Gastroenterology

## 2021-04-05 ENCOUNTER — Other Ambulatory Visit: Payer: Self-pay

## 2021-04-05 ENCOUNTER — Encounter: Payer: Self-pay | Admitting: Gastroenterology

## 2021-04-05 ENCOUNTER — Ambulatory Visit
Admission: RE | Admit: 2021-04-05 | Discharge: 2021-04-05 | Disposition: A | Discharge status: Home / Self Care | Payer: 59 | Attending: Gastroenterology | Admitting: Gastroenterology

## 2021-04-05 ENCOUNTER — Encounter
Admission: RE | Disposition: A | Discharge status: Home / Self Care | Payer: Self-pay | Source: Home / Self Care | Attending: Gastroenterology

## 2021-04-05 ENCOUNTER — Ambulatory Visit: Payer: 59 | Admitting: Anesthesiology

## 2021-04-05 DIAGNOSIS — K219 Gastro-esophageal reflux disease without esophagitis: Secondary | ICD-10-CM | POA: Diagnosis not present

## 2021-04-05 DIAGNOSIS — Z79899 Other long term (current) drug therapy: Secondary | ICD-10-CM | POA: Diagnosis not present

## 2021-04-05 DIAGNOSIS — E119 Type 2 diabetes mellitus without complications: Secondary | ICD-10-CM | POA: Insufficient documentation

## 2021-04-05 DIAGNOSIS — K3189 Other diseases of stomach and duodenum: Secondary | ICD-10-CM | POA: Diagnosis not present

## 2021-04-05 DIAGNOSIS — Z7984 Long term (current) use of oral hypoglycemic drugs: Secondary | ICD-10-CM | POA: Diagnosis not present

## 2021-04-05 DIAGNOSIS — D175 Benign lipomatous neoplasm of intra-abdominal organs: Secondary | ICD-10-CM | POA: Insufficient documentation

## 2021-04-05 DIAGNOSIS — Z888 Allergy status to other drugs, medicaments and biological substances status: Secondary | ICD-10-CM | POA: Insufficient documentation

## 2021-04-05 DIAGNOSIS — Z7689 Persons encountering health services in other specified circumstances: Secondary | ICD-10-CM | POA: Diagnosis not present

## 2021-04-05 HISTORY — PX: EUS: SHX5427

## 2021-04-05 LAB — GLUCOSE, CAPILLARY: Glucose-Capillary: 87 mg/dL (ref 70–99)

## 2021-04-05 SURGERY — ULTRASOUND, UPPER GI TRACT, ENDOSCOPIC
Anesthesia: General

## 2021-04-05 MED ORDER — PROPOFOL 500 MG/50ML IV EMUL
INTRAVENOUS | Status: DC | PRN
  Administered 2021-04-05: 175 ug/kg/min via INTRAVENOUS

## 2021-04-05 MED ORDER — ACETAMINOPHEN 500 MG PO TABS: 500.0000 mg | ORAL_TABLET | Freq: Once | ORAL | Status: AC

## 2021-04-05 MED ORDER — SODIUM CHLORIDE 0.9 % IV SOLN: INTRAVENOUS | Status: DC

## 2021-04-05 MED ORDER — LIDOCAINE HCL (CARDIAC) PF 100 MG/5ML IV SOSY
PREFILLED_SYRINGE | INTRAVENOUS | Status: DC | PRN
  Administered 2021-04-05: 100 mg via INTRAVENOUS

## 2021-04-05 MED ORDER — MIDAZOLAM HCL 2 MG/2ML IJ SOLN
INTRAMUSCULAR | Status: AC
  Filled 2021-04-05: qty 2

## 2021-04-05 MED ORDER — PROPOFOL 500 MG/50ML IV EMUL
INTRAVENOUS | Status: AC
  Filled 2021-04-05: qty 50

## 2021-04-05 MED ORDER — DEXMEDETOMIDINE (PRECEDEX) IN NS 20 MCG/5ML (4 MCG/ML) IV SYRINGE
PREFILLED_SYRINGE | INTRAVENOUS | Status: AC
  Filled 2021-04-05: qty 5

## 2021-04-05 MED ORDER — LIDOCAINE HCL (PF) 2 % IJ SOLN
INTRAMUSCULAR | Status: AC
  Filled 2021-04-05: qty 5

## 2021-04-05 MED ORDER — ACETAMINOPHEN 500 MG PO TABS
ORAL_TABLET | ORAL | Status: AC
  Administered 2021-04-05: 500 mg
  Filled 2021-04-05: qty 1

## 2021-04-05 MED ORDER — PROPOFOL 10 MG/ML IV BOLUS
INTRAVENOUS | Status: DC | PRN
  Administered 2021-04-05: 80 mg via INTRAVENOUS

## 2021-04-05 MED ORDER — DEXMEDETOMIDINE (PRECEDEX) IN NS 20 MCG/5ML (4 MCG/ML) IV SYRINGE
PREFILLED_SYRINGE | INTRAVENOUS | Status: DC | PRN
  Administered 2021-04-05 (×2): 8 ug via INTRAVENOUS

## 2021-04-05 NOTE — Anesthesia Postprocedure Evaluation (Signed)
Anesthesia Post Note  Patient: Alexandra Mora  Procedure(s) Performed: FULL UPPER ENDOSCOPIC ULTRASOUND (EUS) RADIAL  Anesthesia Type: General Anesthetic complications: no   No notable events documented.   Last Vitals:  Vitals:   04/05/21 1415 04/05/21 1425  BP: 129/82 (!) 131/92  Pulse: 77 83  Resp: 18 17  Temp:    SpO2: 100% 98%    Last Pain:  Vitals:   04/05/21 1425  TempSrc:   PainSc: 0-No pain                 Phill Mutter

## 2021-04-05 NOTE — Anesthesia Procedure Notes (Signed)
Date/Time: 04/05/2021 1:00 PM Performed by: Johnna Acosta, CRNA Pre-anesthesia Checklist: Patient identified, Emergency Drugs available, Suction available, Patient being monitored and Timeout performed Patient Re-evaluated:Patient Re-evaluated prior to induction Oxygen Delivery Method: Simple face mask Preoxygenation: Pre-oxygenation with 100% oxygen Induction Type: IV induction

## 2021-04-05 NOTE — Transfer of Care (Signed)
Immediate Anesthesia Transfer of Care Note  Patient: Alexandra Mora  Procedure(s) Performed: FULL UPPER ENDOSCOPIC ULTRASOUND (EUS) RADIAL  Patient Location: PACU  Anesthesia Type:General  Level of Consciousness: awake and drowsy  Airway & Oxygen Therapy: Patient Spontanous Breathing and Patient connected to face mask oxygen  Post-op Assessment: Report given to RN and Post -op Vital signs reviewed and stable  Post vital signs: Reviewed and stable  Last Vitals:  Vitals Value Taken Time  BP 122/80 04/05/21 1355  Temp    Pulse 85 04/05/21 1357  Resp 15 04/05/21 1357  SpO2 100 % 04/05/21 1357    Last Pain:  Vitals:   04/05/21 1201  TempSrc: Temporal  PainSc: 0-No pain         Complications: No notable events documented.

## 2021-04-05 NOTE — Op Note (Signed)
[facility] Gastroenterology Patient Name: Alexandra Mora Procedure Date: 04/05/2021 12:12 PM MRN: [id number] Account #: [id number] Date of Birth: 1961-03-29 Admit Type: Outpatient Age: 60 Room: [facility] ENDO ROOM 3 Gender: Female Note Status: Finalized Procedure:             Upper EUS Indications:           Gastric deformity on endoscopy/Subepithelial tumor                         versus extrinsic compression Providers:             Zada Girt Referring MD:          Gladstone Lighter, MD (Referring MD), Lucilla Lame MD,                         MD (Referring MD) Medicines:             Monitored Anesthesia Care Complications:         No immediate complications. Procedure:             Pre-Anesthesia Assessment:                        - Please see pre-anesthesia assessment documentation                         already completed in Epic.                        After obtaining informed consent, the endoscope was                         passed under direct vision. Throughout the procedure,                         the patient's blood pressure, pulse, and oxygen                         saturations were monitored continuously. The Endoscope                         was introduced through the mouth, and advanced to the                         duodenum and the EUS scope was advanced to the stomach                         for ultrasound examination. The upper EUS was                         accomplished without difficulty. The patient tolerated                         the procedure well. Findings:      ENDOSCOPIC FINDING: :      The examined esophagus was endoscopically normal.      An endoclip was found in the gastric body.      A small, submucosal, non-circumferential mass with no bleeding and no       stigmata of recent bleeding was found at the incisura. After the  EUS       exam, biopsies were taken with a cold forceps for histology in a       tunneling fashion. After  unroofing the mucosa, yellow lipomatous       appearing tissue was sampled.      The examined duodenum was endoscopically normal.      ENDOSONOGRAPHIC FINDING: :      A round intramural (subepithelial) lesion was found in the incisura of       the stomach. The lesion was hyperechoic. Sonographically, the lesion       appeared to originate from the submucosa (Layer 3). The lesion measured       7 mm (in maximum thickness). The lesion also measured 6 mm in diameter.       The outer endosonographic borders were well defined. Impression:            - Normal esophagus.                        - An endoclip was found in the stomach.                        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 After                         unroofing the mucosa, yellow lipomatous appearing                         tissue was sampled.                        - Normal examined duodenum.                        EUS:                        - An intramural (subepithelial) lesion was found in                         the incisura of the stomach. The lesion appeared to                         originate from within the submucosa (Layer 3). Tissue                         was obtained from this exam, and results are pending.                         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 Recommendation:        - Patient has a contact number available for                         emergencies. The signs and symptoms of potential                         delayed complications were discussed with the patient.  Return to normal activities tomorrow. Written                         discharge instructions were provided to the patient.                        - Await path results.                        - Return to referring physician.                        - The findings and recommendations were discussed with                         the patient and their  family. Procedure Code(s):     --- Professional ---                        [phone], 68, Esophagogastroduodenoscopy, flexible,                         transoral; with endoscopic ultrasound examination                         limited to the esophagus, stomach or duodenum, and                         adjacent structures                        43239, 52, Esophagogastroduodenoscopy, flexible,                         transoral; with biopsy, single or multiple CPT copyright 2019 American Medical Association. All rights reserved. The codes documented in this report are preliminary and upon coder review may  be revised to meet current compliance requirements. Attending Participation:      I personally performed the entire procedure. Zada Girt,  04/05/2021 2:01:59 PM This report has been signed electronically. Number of Addenda: 0 Note Initiated On: 04/05/2021 12:12 PM Estimated Blood Loss:  Estimated blood loss was minimal.      [facility]

## 2021-04-05 NOTE — H&P (Signed)
Outpatient short stay form Pre-procedure 04/05/2021 12:48 PM Alexandra Mora,  Sammuel Hines, MD  Primary Physician: Gladstone Lighter, MD   Reason for visit:  EUS for Submucosal gastric lesion.   History of present illness:  Lesion diagnosed at time of EGD for dysphagia and heartburn. No bx done. No abd pain.    Current Facility-Administered Medications:    0.9 %  sodium chloride infusion, , Intravenous, Continuous, Brogan Martis, MD, Last Rate: 20 mL/hr at 04/05/21 1215, Restarted at 04/05/21 1229  Medications Prior to Admission  Medication Sig Dispense Refill Last Dose   Cholecalciferol (VITAMIN D3) 5000 UNITS CAPS Take 1 capsule by mouth daily.    04/04/2021   cyanocobalamin 1000 MCG tablet Take 2,000 mcg by mouth daily.   04/04/2021   dapagliflozin propanediol (FARXIGA) 5 MG TABS tablet Take 1 tablet (5 mg total) by mouth once daily 90 tablet 1 04/04/2021   Docusate Sodium (COLACE PO) Take by mouth daily.   Past Month   fenofibrate (TRICOR) 145 MG tablet TAKE 1 TABLET (145 MG TOTAL) BY MOUTH DAILY. 90 tablet 3 04/04/2021   lisinopril (ZESTRIL) 10 MG tablet TAKE 1 TABLET (10 MG TOTAL) BY MOUTH DAILY. 90 tablet 3 04/04/2021   lubiprostone (AMITIZA) 8 MCG capsule Take 1 capsule (8 mcg total) by mouth 2 (two) times daily with meals for 30 days 180 capsule 1 04/04/2021   pantoprazole (PROTONIX) 40 MG tablet TAKE 1 TABLET BY MOUTH DAILY. 30 tablet 11 04/05/2021 at 0545   Vitamin D, Ergocalciferol, (DRISDOL) 1.25 MG (50000 UNIT) CAPS capsule TAKE 1 CAPSULE (50,000 UNITS TOTAL) BY MOUTH EVERY 7 (SEVEN) DAYS. 12 capsule 3 04/04/2021   dapagliflozin propanediol (FARXIGA) 5 MG TABS tablet TAKE 1 TABLET (5 MG TOTAL) BY MOUTH DAILY BEFORE BREAKFAST. 90 tablet 1    esomeprazole (NEXIUM) 40 MG capsule TAKE 1 CAPSULE BY MOUTH DAILY. 30 capsule 11    etodolac (LODINE) 500 MG tablet TAKE 1 TABLET BY MOUTH TWO TIMES DAILY 60 tablet 1    lactulose (CHRONULAC) 10 GM/15ML solution Take 15 mLs by mouth once daily as needed for  Constipation for up to 10 days (Patient not taking: No sig reported) 300 mL 1 Not Taking   polyethylene glycol-electrolytes (NULYTELY WITH FLAVOR PACKS) 420 g solution Take 4,000 mLs by mouth as directed. (Patient not taking: Reported on 04/05/2021) 4000 mL 0 Completed Course    Allergies  Allergen Reactions   Dexilant [Dexlansoprazole]     Severe diarrhea   Statins Other (See Comments)    Increased CK levels   Zetia [Ezetimibe] Other (See Comments)    Increased CK level and muscle cramps    Past Medical History:  Diagnosis Date   Arthritis    lower back   Benign paroxysmal positional vertigo    Diabetes mellitus without complication (HCC)    Elevated CK    GERD (gastroesophageal reflux disease)    Hypercholesteremia    Hyperglycemia    Hypertension    Insomnia    Menopausal disorder     Review of systems:      Physical Exam  Alert and in NAD.    Pertinant exam for procedure: Abd soft NT no masses.    Planned proceedures: EUS and possible biopsy or FNA.    Zada Girt MD Gastroenterology 04/05/2021  12:48 PM

## 2021-04-05 NOTE — Anesthesia Preprocedure Evaluation (Signed)
Anesthesia Evaluation    Airway Mallampati: II  TM Distance: >3 FB Neck ROM: Full    Dental no notable dental hx.    Pulmonary    Pulmonary exam normal        Cardiovascular hypertension, Pt. on medications Normal cardiovascular exam     Neuro/Psych PSYCHIATRIC DISORDERS Anxiety  Neuromuscular disease    GI/Hepatic GERD  Medicated,  Endo/Other  diabetes, Well Controlled, Oral Hypoglycemic Agents  Renal/GU Renal disease     Musculoskeletal  (+) Arthritis ,   Abdominal   Peds  Hematology   Anesthesia Other Findings Arthritis  lower back  Benign paroxysmal positional vertigo  Diabetes mellitus without complication (HCC)    Elevated CK    GERD (gastroesophageal reflux disease) Hypercholesteremia    Hyperglycemia    Hypertension    Insomnia    Menopausal disorder       Reproductive/Obstetrics                            Anesthesia Physical Anesthesia Plan  ASA: 3  Anesthesia Plan: General   Post-op Pain Management:    Induction: Intravenous  PONV Risk Score and Plan: 2 and Propofol infusion and TIVA  Airway Management Planned: Natural Airway and Nasal Cannula  Additional Equipment:   Intra-op Plan:   Post-operative Plan:   Informed Consent: I have reviewed the patients History and Physical, chart, labs and discussed the procedure including the risks, benefits and alternatives for the proposed anesthesia with the patient or authorized representative who has indicated his/her understanding and acceptance.       Plan Discussed with: CRNA, Anesthesiologist and Surgeon  Anesthesia Plan Comments:         Anesthesia Quick Evaluation

## 2021-04-06 ENCOUNTER — Encounter: Payer: Self-pay | Admitting: Gastroenterology

## 2021-04-06 LAB — SURGICAL PATHOLOGY

## 2021-04-10 ENCOUNTER — Inpatient Hospital Stay: Payer: 59 | Attending: Oncology | Admitting: Licensed Clinical Social Worker

## 2021-04-10 ENCOUNTER — Inpatient Hospital Stay: Payer: 59

## 2021-04-10 ENCOUNTER — Encounter: Payer: Self-pay | Admitting: Licensed Clinical Social Worker

## 2021-04-10 ENCOUNTER — Other Ambulatory Visit: Payer: Self-pay

## 2021-04-10 ENCOUNTER — Ambulatory Visit: Payer: 59 | Attending: Internal Medicine

## 2021-04-10 DIAGNOSIS — Z8041 Family history of malignant neoplasm of ovary: Secondary | ICD-10-CM | POA: Diagnosis not present

## 2021-04-10 DIAGNOSIS — Z86010 Personal history of colonic polyps: Secondary | ICD-10-CM | POA: Diagnosis not present

## 2021-04-10 DIAGNOSIS — Z23 Encounter for immunization: Secondary | ICD-10-CM

## 2021-04-10 DIAGNOSIS — Z803 Family history of malignant neoplasm of breast: Secondary | ICD-10-CM | POA: Diagnosis not present

## 2021-04-10 DIAGNOSIS — Z800 Family history of malignant neoplasm of digestive organs: Secondary | ICD-10-CM

## 2021-04-10 DIAGNOSIS — Q859 Phakomatosis, unspecified: Secondary | ICD-10-CM

## 2021-04-10 MED ORDER — PFIZER-BIONT COVID-19 VAC-TRIS 30 MCG/0.3ML IM SUSP
INTRAMUSCULAR | 0 refills | Status: AC
  Filled 2021-04-10: qty 0.3, 1d supply, fill #0

## 2021-04-10 NOTE — Progress Notes (Signed)
   Covid-19 Vaccination Clinic  Name:  Alexandra Mora    MRN: [id number] DOB: [birth year]  04/10/2021  [name] was observed post Covid-19 immunization for 15 minutes without incident. She was provided with Vaccine Information Sheet and instruction to access the V-Safe system.   [name] was instructed to call 911 with any severe reactions post vaccine: Difficulty breathing  Swelling of face and throat  A fast heartbeat  A bad rash all over body  Dizziness and weakness   Immunizations Administered     Name Date Dose VIS Date Route   PFIZER Comrnaty(Gray TOP) Covid-19 Vaccine 04/10/2021 11:57 AM 0.3 mL 08/24/2020 Intramuscular   Manufacturer: [place]   Lot: [id number]   [place]: [place], PharmD, MBA Clinical Acute Care Pharmacist

## 2021-04-10 NOTE — Progress Notes (Signed)
REFERRING PROVIDER: Lucilla Lame, MD [address] [place] ,  [place] [address]  PRIMARY PROVIDER:  Gladstone Lighter, MD  PRIMARY REASON FOR VISIT:  1. Family history of ovarian cancer   2. Family history of breast cancer   3. History of colon polyps   4. Hamartomatous polyp of stomach ([place])   5. Family history of colon cancer      HISTORY OF PRESENT ILLNESS:   Alexandra Mora, a 60 y.o. female, was seen for a [place] cancer genetics consultation at the request of Dr. Allen Norris due to a personal history of a hamartomatous gastric polyp and family history of cancer.  Alexandra Mora presents to clinic today to discuss the possibility of a hereditary predisposition to cancer, genetic testing, and to further clarify her future cancer risks, as well as potential cancer risks for family members.   IMs. Mora is a 60 y.o. female with no personal history of cancer.  She had a colonoscopy in 2018 that revealed 5 hyperplastic polyps and 1 tubular adenoma. In 2022, she had a colonoscopy that revealed a gastric polyp with hamartomatous features and 1 hyperplastic colon polyp.   CANCER HISTORY:  Oncology History   No history exists.     RISK FACTORS:  Menarche was at age 53.  First live birth at age 31.  Ovaries intact: yes.  Hysterectomy: yes.  Menopausal status: postmenopausal.  HRT use: 0 years. Colonoscopy: yes;  details above . Mammogram within the last year: yes.  Past Medical History:  Diagnosis Date   Arthritis    lower back   Benign paroxysmal positional vertigo    Diabetes mellitus without complication (HCC)    Elevated CK    Family history of breast cancer    Family history of colon cancer    Family history of ovarian cancer    GERD (gastroesophageal reflux disease)    Hamartomatous polyp of stomach (HCC)    Hypercholesteremia    Hyperglycemia    Hypertension    Insomnia    Menopausal disorder     Past Surgical History:  Procedure Laterality Date   ABDOMINAL HYSTERECTOMY      COLONOSCOPY WITH PROPOFOL N/A 06/13/2017   Procedure: COLONOSCOPY WITH PROPOFOL;  Surgeon: Lucilla Lame, MD;  Location: [facility];  Service: Gastroenterology;  Laterality: N/A;   COLONOSCOPY WITH PROPOFOL N/A 02/26/2021   Procedure: COLONOSCOPY WITH PROPOFOL;  Surgeon: Lucilla Lame, MD;  Location: [facility];  Service: Endoscopy;  Laterality: N/A;   ESOPHAGOGASTRODUODENOSCOPY (EGD) WITH PROPOFOL N/A 02/26/2021   Procedure: ESOPHAGOGASTRODUODENOSCOPY (EGD) WITH PROPOFOL;  Surgeon: Lucilla Lame, MD;  Location: [facility];  Service: Endoscopy;  Laterality: N/A;   EUS N/A 04/05/2021   Procedure: FULL UPPER ENDOSCOPIC ULTRASOUND (EUS) RADIAL;  Surgeon: Jola Schmidt, MD;  Location: [facility];  Service: Endoscopy;  Laterality: N/A;   LAPAROSCOPIC CHOLECYSTECTOMY     POLYPECTOMY  06/13/2017   Procedure: POLYPECTOMY;  Surgeon: Lucilla Lame, MD;  Location: [facility];  Service: Gastroenterology;;   POLYPECTOMY N/A 02/26/2021   Procedure: POLYPECTOMY;  Surgeon: Lucilla Lame, MD;  Location: [facility];  Service: Endoscopy;  Laterality: N/A;   TOTAL ABDOMINAL HYSTERECTOMY W/ BILATERAL SALPINGOOPHORECTOMY      Social History   Socioeconomic History   Marital status: Single    Spouse name: Not on file   Number of children: 1   Years of education: Not on file   Highest education level: Not on file  Occupational History   Not on file  Tobacco Use   Smoking status: Never   Smokeless tobacco: Never  Vaping Use   Vaping Use: Not on file  Substance and Sexual Activity   Alcohol use: No   Drug use: No   Sexual activity: Never  Other Topics Concern   Not on file  Social History Narrative   Not on file   Social Determinants of Health   Financial Resource Strain: Not on file  Food Insecurity: Not on file  Transportation Needs: Not on file  Physical Activity: Not on file  Stress: Not on file  Social Connections: Not on file     FAMILY HISTORY:   We obtained a detailed, 4-generation family history.  Significant diagnoses are listed below: Family History  Problem Relation Age of Onset   Heart disease Mother    Heart attack Mother 78   Hyperlipidemia Mother    Hypertension Mother    Stroke Mother    Hypertension Father    Hypertension Sister    Hypertension Brother    Hypertension Sister    Hypertension Sister    Colon cancer Other    Diabetes Other    Breast cancer Maternal Aunt    Breast cancer Maternal Aunt    Alexandra Mora has 1 daughter, 18. She had 3 brothers and 3 sisters. None have had cancer.   Alexandra Mora mother is living at 76. Patient had 6 maternal uncles, 8 maternal aunts. Two uncles had GI cancer and died in their 37s. Two aunts had breast cancer in their 39s. One of these aunts' daughters had breast as well in her 68s. Another aunt had ovarian cancer and died at 66, and another aunt ahd colon cancer in her 32s. Maternal grandparents did not have cancer.  Alexandra Mora father is living at 47 and is an only child. Patient's paternal grandfather had colon cancer in his 49s. Grandmother died at 10, no cancer.   Alexandra Mora is unaware of previous family history of genetic testing for hereditary cancer risks. Patient's maternal ancestors are of African American descent, and paternal ancestors are of African American descent. There is no reported Ashkenazi Jewish ancestry. There is no known consanguinity.    GENETIC COUNSELING ASSESSMENT: Alexandra Mora is a 60 y.o. female with a personal history of hamartomatous polyps and family history of cancer which is somewhat suggestive of a hereditary cancer syndrome and predisposition to cancer. We, therefore, discussed and recommended the following at today's visit.   DISCUSSION: We discussed that approximately 5-10% of cancer is hereditary. Most cases of hereditary breast/ovarian cancer are associated with BRCA1/BRCA2 genes, although there are other genes associated with hereditary  cancer. Some of these genes are associated with colon cancer/colon polyps. Cancers and risks are gene specific. One particular condition that we can see when someone has hamartomatous polyps is Peutz-Jeghers syndrome. This condition increases risks for certain cancers such as breast, ovarian, colon and individuals often have hamartomatous polyps and mucocutaneous hyperpigmentation of lips, mouth, palms, soles, and genitalia. Alexandra Mora does not believe she has/ has ever had this hyperpigmentation, and this is the first hamartomatous polyps she has had. Of note, the pathology report states that the possibility of this polyp being reactive/hyperplastic polyp cannot be ruled out.  We discussed that testing is beneficial for several reasons including knowing about cancer risks, identifying potential screening and risk-reduction options that may be appropriate, and to understand if other family members could be at risk for cancer and allow them to undergo genetic testing.  We reviewed the characteristics, features and inheritance patterns of hereditary cancer syndromes. We also discussed genetic testing, including the appropriate family members to test, the process of testing, insurance coverage and turn-around-time for results. We discussed the implications of a negative, positive and/or variant of uncertain significant result. We recommended Ms. Mckinstry pursue genetic testing for the [facility] CancerNext-Expanded+RNA gene panel.   Based on Alexandra Mora's family history of cancer, she meets medical criteria for genetic testing. Despite that she meets criteria, she may still have an out of pocket cost. We discussed that if her out of pocket cost for testing is over $100, the laboratory will call and confirm whether she wants to proceed with testing.  If the out of pocket cost of testing is less than $100 she will be billed by the genetic testing laboratory.   PLAN: After considering the risks, benefits, and limitations, Ms.  Mora provided informed consent to pursue genetic testing and the blood sample was sent to [facility] for analysis of the CancerNext-Expanded+RNA Panel. Results should be available within approximately 2-3 weeks' time, at which point they will be disclosed by telephone to Alexandra Mora, as will any additional recommendations warranted by these results. Alexandra Mora will receive a summary of her genetic counseling visit and a copy of her results once available. This information will also be available in Epic.   Alexandra Mora questions were answered to her satisfaction today. Our contact information was provided should additional questions or concerns arise. Thank you for the referral and allowing [place] to share in the care of your patient.   Faith Rogue, MS, [facility] Genetic Counselor [email or web address] Phone: [phone]  The patient was seen for a total of 30 minutes in face-to-face genetic counseling.  Patient was seen alone. Dr. Grayland Ormond was available for discussion regarding this case.   _______________________________________________________________________ For Office Staff:  Number of people involved in session: 1 Was an Intern/ student involved with case: no

## 2021-04-29 MED FILL — Pantoprazole Sodium EC Tab 40 MG (Base Equiv): ORAL | 90 days supply | Qty: 90 | Fill #1 | Status: AC

## 2021-04-30 ENCOUNTER — Other Ambulatory Visit: Payer: Self-pay

## 2021-05-01 ENCOUNTER — Encounter: Payer: Self-pay | Admitting: Licensed Clinical Social Worker

## 2021-05-01 ENCOUNTER — Ambulatory Visit: Payer: Self-pay | Admitting: Licensed Clinical Social Worker

## 2021-05-01 ENCOUNTER — Telehealth: Payer: Self-pay | Admitting: Licensed Clinical Social Worker

## 2021-05-01 ENCOUNTER — Other Ambulatory Visit: Payer: Self-pay

## 2021-05-01 DIAGNOSIS — Z8041 Family history of malignant neoplasm of ovary: Secondary | ICD-10-CM

## 2021-05-01 DIAGNOSIS — Z1379 Encounter for other screening for genetic and chromosomal anomalies: Secondary | ICD-10-CM

## 2021-05-01 DIAGNOSIS — Q859 Phakomatosis, unspecified: Secondary | ICD-10-CM

## 2021-05-01 DIAGNOSIS — Z803 Family history of malignant neoplasm of breast: Secondary | ICD-10-CM

## 2021-05-01 NOTE — Progress Notes (Addendum)
HPI:  Ms. Hedberg was previously seen in the [facility] clinic due to a family history of cancer, a hamartomatous stomach polyp, and concerns regarding a hereditary predisposition to cancer. Please refer to our prior cancer genetics clinic note for more information regarding our discussion, assessment and recommendations, at the time. Ms. Hinger recent genetic test results were disclosed to her, as were recommendations warranted by these results. These results and recommendations are discussed in more detail below.  CANCER HISTORY:  Oncology History   No history exists.    FAMILY HISTORY:  We obtained a detailed, 4-generation family history.  Significant diagnoses are listed below: Family History  Problem Relation Age of Onset   Heart disease Mother    Heart attack Mother 27   Hyperlipidemia Mother    Hypertension Mother    Stroke Mother    Hypertension Father    Hypertension Sister    Hypertension Brother    Hypertension Sister    Hypertension Sister    Colon cancer Other    Diabetes Other    Breast cancer Maternal Aunt    Breast cancer Maternal Aunt    Ms. Vickrey has 1 daughter, 84. She had 3 brothers and 3 sisters. None have had cancer.    Ms. Carbin mother is living at 12. Patient had 6 maternal uncles, 8 maternal aunts. Two uncles had GI cancer and died in their 36s. Two aunts had breast cancer in their 70s. One of these aunts' daughters had breast as well in her 34s. Another aunt had ovarian cancer and died at 69, and another aunt ahd colon cancer in her 32s. Maternal grandparents did not have cancer.   Ms. [place] father is living at 81 and is an only child. Patient's paternal grandfather had colon cancer in his 85s. Grandmother died at 17, no cancer.    Ms. Mirza is unaware of previous family history of genetic testing for hereditary cancer risks. Patient's maternal ancestors are of African American descent, and paternal ancestors are of African American  descent. There is no reported Ashkenazi Jewish ancestry. There is no known consanguinity.      GENETIC TEST RESULTS: Genetic testing reported out on 04/26/2021 through the [facility] CancerNext-Expanded+RNA cancer panel found no pathogenic mutations.   The CancerNext-Expanded + RNAinsight gene panel offered by [employer] and includes sequencing and rearrangement analysis for the following 77 genes: IP, ALK, APC*, ATM*, AXIN2, BAP1, BARD1, BLM, BMPR1A, BRCA1*, BRCA2*, BRIP1*, CDC73, CDH1*,CDK4, CDKN1B, CDKN2A, CHEK2*, CTNNA1, DICER1, FANCC, FH, FLCN, GALNT12, KIF1B, LZTR1, MAX, MEN1, MET, MLH1*, MSH2*, MSH3, MSH6*, MUTYH*, NBN, NF1*, NF2, NTHL1, PALB2*, PHOX2B, PMS2*, POT1, PRKAR1A, PTCH1, PTEN*, RAD51C*, RAD51D*,RB1, RECQL, RET, SDHA, SDHAF2, SDHB, SDHC, SDHD, SMAD4, SMARCA4, SMARCB1, SMARCE1, STK11, SUFU, TMEM127, TP53*,TSC1, TSC2, VHL and XRCC2 (sequencing and deletion/duplication); EGFR, EGLN1, HOXB13, KIT, MITF, PDGFRA, POLD1 and POLE (sequencing only); EPCAM and GREM1 (deletion/duplication only).   The test report has been scanned into EPIC and is located under the Molecular Pathology section of the Results Review tab.  A portion of the result report is included below for reference.     We discussed that because current genetic testing is not perfect, it is possible there may be a gene mutation in one of these genes that current testing cannot detect, but that chance is small.  There could be another gene that has not yet been discovered, or that we have not yet tested, that is responsible for the cancer diagnoses in the family. It is also possible  there is a hereditary cause for the cancer in the family that Ms. House did not inherit and therefore was not identified in her testing.  Therefore, it is important to remain in touch with cancer genetics in the future so that we can continue to offer Ms. Kriesel the most up to date genetic testing.   ADDITIONAL GENETIC TESTING: We discussed with Ms. Mcinroy  that her genetic testing was fairly extensive.  If there are genes identified to increase cancer risk that can be analyzed in the future, we would be happy to discuss and coordinate this testing at that time.    CANCER SCREENING RECOMMENDATIONS: Ms. Trego test result is considered negative (normal).  This means that we have not identified a hereditary cause for her family history of cancer at this time, or for her hamartomatous stomach polyp.  While reassuring, this does not definitively rule out a hereditary predisposition to cancer. It is still possible that there could be genetic mutations that are undetectable by current technology. There could be genetic mutations in genes that have not been tested or identified to increase cancer risk.  Therefore, it is recommended she continue to follow the cancer management and screening guidelines provided by her GI and primary healthcare provider.   An individual's cancer risk and medical management are not determined by genetic test results alone. Overall cancer risk assessment incorporates additional factors, including personal medical history, family history, and any available genetic information that may result in a personalized plan for cancer prevention and surveillance.  Based on Ms. Caley's personal and family history of cancer as well as her genetic test results, risk model Harriett Rush was used to estimate her risk of developing breast cancer. This estimates her lifetime risk of developing breast cancer to be approximately 12%.  The patient's lifetime breast cancer risk is a preliminary estimate based on available information using one of several models endorsed by the [employer] (ACS). The ACS recommends consideration of breast MRI screening as an adjunct to mammography for patients at high risk (defined as 20% or greater lifetime risk).  This risk estimate can change over time and may be repeated to reflect new information in her personal  or family history in the future.    RECOMMENDATIONS FOR FAMILY MEMBERS:  Relatives in this family might be at some increased risk of developing cancer, over the general population risk, simply due to the family history of cancer.  We recommended female relatives in this family have a yearly mammogram beginning at age 54, or 22 years younger than the earliest onset of cancer, an annual clinical breast exam, and perform monthly breast self-exams. Female relatives in this family should also have a gynecological exam as recommended by their primary provider.  All family members should be referred for colonoscopy starting at age 18.    It is also possible there is a hereditary cause for the cancer in Ms. Kloth's family that she did not inherit and therefore was not identified in her.  Based on Ms. Feagans's family history, we recommended maternal relatives have genetic counseling and testing. Ms. Mcclintic will let us know if we can be of any assistance in coordinating genetic counseling and/or testing for these family members.  FOLLOW-UP: Lastly, we discussed with Ms. Kester that cancer genetics is a rapidly advancing field and it is possible that new genetic tests will be appropriate for her and/or her family members in the future. We encouraged her to remain in contact with cancer  genetics on an annual basis so we can update her personal and family histories and let her know of advances in cancer genetics that may benefit this family.   Our contact number was provided. Ms. Lobello questions were answered to her satisfaction, and she knows she is welcome to call us at anytime with additional questions or concerns.   Faith Rogue, MS, [facility] Genetic Counselor [email or web address] Phone: [phone]

## 2021-05-01 NOTE — Telephone Encounter (Signed)
Revealed negative genetic testing.  We discussed that we do not know why she has had polyps or why there is cancer in the family. It could be due to a different gene that we are not testing, or something our current technology cannot pick up.  It will be important for her to keep in contact with genetics to learn if additional testing may be needed in the future.

## 2021-05-14 ENCOUNTER — Other Ambulatory Visit: Payer: Self-pay

## 2021-05-14 MED FILL — Ergocalciferol Cap 1.25 MG (50000 Unit): ORAL | 84 days supply | Qty: 12 | Fill #1 | Status: AC

## 2021-06-18 ENCOUNTER — Other Ambulatory Visit: Payer: Self-pay

## 2021-06-18 MED FILL — Lisinopril Tab 10 MG: ORAL | 90 days supply | Qty: 90 | Fill #1 | Status: AC

## 2021-06-26 ENCOUNTER — Other Ambulatory Visit: Payer: Self-pay

## 2021-06-26 MED FILL — Fenofibrate Tab 145 MG: ORAL | 90 days supply | Qty: 90 | Fill #2 | Status: AC

## 2021-07-17 ENCOUNTER — Other Ambulatory Visit: Payer: Self-pay

## 2021-07-17 DIAGNOSIS — D472 Monoclonal gammopathy: Secondary | ICD-10-CM | POA: Diagnosis not present

## 2021-07-17 DIAGNOSIS — I10 Essential (primary) hypertension: Secondary | ICD-10-CM | POA: Diagnosis not present

## 2021-07-17 DIAGNOSIS — N182 Chronic kidney disease, stage 2 (mild): Secondary | ICD-10-CM | POA: Diagnosis not present

## 2021-07-17 DIAGNOSIS — E1122 Type 2 diabetes mellitus with diabetic chronic kidney disease: Secondary | ICD-10-CM | POA: Diagnosis not present

## 2021-07-17 MED ORDER — FARXIGA 10 MG PO TABS
ORAL_TABLET | ORAL | 11 refills | Status: AC
  Filled 2021-07-17: qty 30, 30d supply, fill #0
  Filled 2021-08-20: qty 90, 90d supply, fill #1
  Filled 2021-11-19: qty 90, 90d supply, fill #2
  Filled 2022-02-20: qty 90, 90d supply, fill #3
  Filled 2022-05-15: qty 60, 60d supply, fill #4

## 2021-07-25 ENCOUNTER — Other Ambulatory Visit: Payer: Self-pay | Admitting: Physician Assistant

## 2021-07-25 ENCOUNTER — Other Ambulatory Visit: Payer: Self-pay | Admitting: Internal Medicine

## 2021-07-25 DIAGNOSIS — Z1231 Encounter for screening mammogram for malignant neoplasm of breast: Secondary | ICD-10-CM

## 2021-07-31 DIAGNOSIS — Z780 Asymptomatic menopausal state: Secondary | ICD-10-CM | POA: Diagnosis not present

## 2021-07-31 DIAGNOSIS — E119 Type 2 diabetes mellitus without complications: Secondary | ICD-10-CM | POA: Diagnosis not present

## 2021-08-01 ENCOUNTER — Other Ambulatory Visit: Payer: Self-pay | Admitting: Physician Assistant

## 2021-08-01 ENCOUNTER — Other Ambulatory Visit: Payer: Self-pay

## 2021-08-01 ENCOUNTER — Other Ambulatory Visit: Payer: Self-pay | Admitting: Gastroenterology

## 2021-08-01 DIAGNOSIS — E559 Vitamin D deficiency, unspecified: Secondary | ICD-10-CM

## 2021-08-02 ENCOUNTER — Other Ambulatory Visit: Payer: Self-pay | Admitting: Gastroenterology

## 2021-08-02 ENCOUNTER — Other Ambulatory Visit: Payer: Self-pay

## 2021-08-02 MED ORDER — PANTOPRAZOLE SODIUM 40 MG PO TBEC
DELAYED_RELEASE_TABLET | Freq: Every day | ORAL | 11 refills | Status: AC
  Filled 2021-08-02: qty 30, 30d supply, fill #0
  Filled 2021-08-30: qty 30, 30d supply, fill #1
  Filled 2021-09-26: qty 90, 90d supply, fill #2
  Filled 2021-12-18: qty 90, 90d supply, fill #3
  Filled 2022-03-20: qty 90, 90d supply, fill #4
  Filled 2022-06-17: qty 30, 30d supply, fill #5

## 2021-08-03 ENCOUNTER — Other Ambulatory Visit: Payer: Self-pay

## 2021-08-06 ENCOUNTER — Other Ambulatory Visit: Payer: Self-pay

## 2021-08-06 ENCOUNTER — Encounter: Payer: Self-pay | Admitting: Physician Assistant

## 2021-08-06 MED ORDER — LUBIPROSTONE 8 MCG PO CAPS
ORAL_CAPSULE | ORAL | 1 refills | Status: AC
  Filled 2021-08-06: qty 180, 90d supply, fill #0
  Filled 2021-11-19: qty 180, 90d supply, fill #1

## 2021-08-07 ENCOUNTER — Other Ambulatory Visit: Payer: Self-pay

## 2021-08-07 DIAGNOSIS — E119 Type 2 diabetes mellitus without complications: Secondary | ICD-10-CM | POA: Diagnosis not present

## 2021-08-07 DIAGNOSIS — N182 Chronic kidney disease, stage 2 (mild): Secondary | ICD-10-CM | POA: Diagnosis not present

## 2021-08-07 DIAGNOSIS — Z780 Asymptomatic menopausal state: Secondary | ICD-10-CM | POA: Diagnosis not present

## 2021-08-07 DIAGNOSIS — K5909 Other constipation: Secondary | ICD-10-CM | POA: Diagnosis not present

## 2021-08-07 DIAGNOSIS — Z0000 Encounter for general adult medical examination without abnormal findings: Secondary | ICD-10-CM | POA: Diagnosis not present

## 2021-08-07 MED ORDER — FENOFIBRATE 145 MG PO TABS
ORAL_TABLET | ORAL | 3 refills | Status: AC
  Filled 2021-08-07 – 2021-09-19 (×2): qty 90, 90d supply, fill #0
  Filled 2021-12-18: qty 90, 90d supply, fill #1
  Filled 2022-03-20: qty 90, 90d supply, fill #2
  Filled 2022-06-17: qty 90, 90d supply, fill #3

## 2021-08-07 MED ORDER — ERGOCALCIFEROL 1.25 MG (50000 UT) PO CAPS
ORAL_CAPSULE | ORAL | 3 refills | Status: AC
  Filled 2021-08-07: qty 12, 84d supply, fill #0
  Filled 2021-10-29: qty 12, 84d supply, fill #1
  Filled 2022-01-21: qty 12, 84d supply, fill #2

## 2021-08-07 MED ORDER — LISINOPRIL 10 MG PO TABS
10.0000 mg | ORAL_TABLET | Freq: Every day | ORAL | 3 refills | Status: AC
  Filled 2021-08-07 – 2021-09-19 (×2): qty 90, 90d supply, fill #0

## 2021-08-07 MED ORDER — HYDROXYZINE PAMOATE 25 MG PO CAPS
ORAL_CAPSULE | ORAL | 2 refills | Status: AC
  Filled 2021-08-07: qty 30, 30d supply, fill #0

## 2021-08-08 ENCOUNTER — Other Ambulatory Visit: Payer: Self-pay

## 2021-08-10 ENCOUNTER — Other Ambulatory Visit: Payer: Self-pay

## 2021-08-21 ENCOUNTER — Other Ambulatory Visit: Payer: Self-pay

## 2021-08-29 ENCOUNTER — Ambulatory Visit
Admission: RE | Admit: 2021-08-29 | Discharge: 2021-08-29 | Disposition: A | Discharge status: Home / Self Care | Payer: 59 | Source: Ambulatory Visit | Attending: Internal Medicine | Admitting: Internal Medicine

## 2021-08-29 ENCOUNTER — Other Ambulatory Visit: Payer: Self-pay

## 2021-08-29 DIAGNOSIS — Z1231 Encounter for screening mammogram for malignant neoplasm of breast: Secondary | ICD-10-CM | POA: Insufficient documentation

## 2021-08-30 ENCOUNTER — Other Ambulatory Visit: Payer: Self-pay

## 2021-09-19 ENCOUNTER — Other Ambulatory Visit: Payer: Self-pay

## 2021-09-26 ENCOUNTER — Other Ambulatory Visit: Payer: Self-pay

## 2021-10-29 ENCOUNTER — Other Ambulatory Visit: Payer: Self-pay

## 2021-11-19 ENCOUNTER — Other Ambulatory Visit: Payer: Self-pay

## 2021-11-19 DIAGNOSIS — E1122 Type 2 diabetes mellitus with diabetic chronic kidney disease: Secondary | ICD-10-CM | POA: Diagnosis not present

## 2021-11-19 DIAGNOSIS — D472 Monoclonal gammopathy: Secondary | ICD-10-CM | POA: Diagnosis not present

## 2021-11-19 DIAGNOSIS — I10 Essential (primary) hypertension: Secondary | ICD-10-CM | POA: Diagnosis not present

## 2021-11-19 DIAGNOSIS — N1831 Chronic kidney disease, stage 3a: Secondary | ICD-10-CM | POA: Diagnosis not present

## 2021-11-19 MED ORDER — LOSARTAN POTASSIUM 50 MG PO TABS
ORAL_TABLET | ORAL | 11 refills | Status: DC
  Filled 2021-11-19: qty 90, 90d supply, fill #0
  Filled 2022-02-20: qty 90, 90d supply, fill #1
  Filled 2022-05-15: qty 90, 90d supply, fill #2
  Filled 2022-08-16: qty 90, 90d supply, fill #3

## 2021-11-20 ENCOUNTER — Other Ambulatory Visit: Payer: Self-pay

## 2021-12-18 ENCOUNTER — Other Ambulatory Visit: Payer: Self-pay

## 2022-01-16 DIAGNOSIS — H5203 Hypermetropia, bilateral: Secondary | ICD-10-CM | POA: Diagnosis not present

## 2022-01-21 ENCOUNTER — Other Ambulatory Visit: Payer: Self-pay

## 2022-01-29 DIAGNOSIS — E785 Hyperlipidemia, unspecified: Secondary | ICD-10-CM | POA: Diagnosis not present

## 2022-01-29 DIAGNOSIS — E119 Type 2 diabetes mellitus without complications: Secondary | ICD-10-CM | POA: Diagnosis not present

## 2022-02-06 ENCOUNTER — Other Ambulatory Visit: Payer: Self-pay

## 2022-02-06 DIAGNOSIS — Z79899 Other long term (current) drug therapy: Secondary | ICD-10-CM | POA: Diagnosis not present

## 2022-02-06 DIAGNOSIS — E119 Type 2 diabetes mellitus without complications: Secondary | ICD-10-CM | POA: Diagnosis not present

## 2022-02-06 DIAGNOSIS — F5101 Primary insomnia: Secondary | ICD-10-CM | POA: Diagnosis not present

## 2022-02-06 DIAGNOSIS — K5909 Other constipation: Secondary | ICD-10-CM | POA: Diagnosis not present

## 2022-02-06 DIAGNOSIS — R5383 Other fatigue: Secondary | ICD-10-CM | POA: Diagnosis not present

## 2022-02-06 DIAGNOSIS — E785 Hyperlipidemia, unspecified: Secondary | ICD-10-CM | POA: Diagnosis not present

## 2022-02-06 DIAGNOSIS — R7989 Other specified abnormal findings of blood chemistry: Secondary | ICD-10-CM | POA: Diagnosis not present

## 2022-02-06 MED ORDER — QUETIAPINE FUMARATE 100 MG PO TABS
ORAL_TABLET | ORAL | 0 refills | Status: AC
  Filled 2022-02-06: qty 30, 30d supply, fill #0

## 2022-02-06 MED ORDER — GLIPIZIDE ER 2.5 MG PO TB24
ORAL_TABLET | ORAL | 1 refills | Status: DC
  Filled 2022-02-06: qty 90, 90d supply, fill #0
  Filled 2022-05-02: qty 90, 90d supply, fill #1

## 2022-02-20 ENCOUNTER — Other Ambulatory Visit: Payer: Self-pay

## 2022-02-21 ENCOUNTER — Other Ambulatory Visit: Payer: Self-pay

## 2022-02-22 ENCOUNTER — Other Ambulatory Visit: Payer: Self-pay

## 2022-02-25 ENCOUNTER — Other Ambulatory Visit: Payer: Self-pay

## 2022-02-26 ENCOUNTER — Other Ambulatory Visit: Payer: Self-pay

## 2022-02-26 MED ORDER — LUBIPROSTONE 8 MCG PO CAPS
ORAL_CAPSULE | ORAL | 1 refills | Status: AC
  Filled 2022-02-26: qty 180, 90d supply, fill #0
  Filled 2022-05-31: qty 180, 90d supply, fill #1

## 2022-02-27 ENCOUNTER — Other Ambulatory Visit: Payer: Self-pay

## 2022-03-20 ENCOUNTER — Other Ambulatory Visit: Payer: Self-pay

## 2022-03-25 DIAGNOSIS — N1831 Chronic kidney disease, stage 3a: Secondary | ICD-10-CM | POA: Diagnosis not present

## 2022-03-25 DIAGNOSIS — I10 Essential (primary) hypertension: Secondary | ICD-10-CM | POA: Diagnosis not present

## 2022-03-25 DIAGNOSIS — E1122 Type 2 diabetes mellitus with diabetic chronic kidney disease: Secondary | ICD-10-CM | POA: Diagnosis not present

## 2022-04-08 DIAGNOSIS — E119 Type 2 diabetes mellitus without complications: Secondary | ICD-10-CM | POA: Diagnosis not present

## 2022-04-08 DIAGNOSIS — K5909 Other constipation: Secondary | ICD-10-CM | POA: Diagnosis not present

## 2022-04-08 DIAGNOSIS — R202 Paresthesia of skin: Secondary | ICD-10-CM | POA: Diagnosis not present

## 2022-05-03 ENCOUNTER — Other Ambulatory Visit: Payer: Self-pay

## 2022-05-09 ENCOUNTER — Other Ambulatory Visit: Payer: Self-pay

## 2022-05-09 DIAGNOSIS — R202 Paresthesia of skin: Secondary | ICD-10-CM | POA: Diagnosis not present

## 2022-05-09 DIAGNOSIS — E119 Type 2 diabetes mellitus without complications: Secondary | ICD-10-CM | POA: Diagnosis not present

## 2022-05-09 DIAGNOSIS — Z780 Asymptomatic menopausal state: Secondary | ICD-10-CM | POA: Diagnosis not present

## 2022-05-09 DIAGNOSIS — N182 Chronic kidney disease, stage 2 (mild): Secondary | ICD-10-CM | POA: Diagnosis not present

## 2022-05-09 DIAGNOSIS — F5101 Primary insomnia: Secondary | ICD-10-CM | POA: Diagnosis not present

## 2022-05-09 MED ORDER — FARXIGA 10 MG PO TABS
10.0000 mg | ORAL_TABLET | Freq: Every day | ORAL | 3 refills | Status: DC
  Filled 2022-07-18: qty 90, 90d supply, fill #0
  Filled 2022-10-17: qty 90, 90d supply, fill #1
  Filled 2023-01-13: qty 90, 90d supply, fill #2
  Filled 2023-04-16: qty 90, 90d supply, fill #3

## 2022-05-09 MED ORDER — LUBIPROSTONE 8 MCG PO CAPS
ORAL_CAPSULE | ORAL | 3 refills | Status: AC
  Filled 2022-09-03: qty 180, 90d supply, fill #0
  Filled 2022-12-02: qty 180, 90d supply, fill #1

## 2022-05-09 MED ORDER — PANTOPRAZOLE SODIUM 40 MG PO TBEC
DELAYED_RELEASE_TABLET | ORAL | 3 refills | Status: DC
  Filled 2022-07-18: qty 90, 90d supply, fill #0
  Filled 2022-10-15: qty 90, 90d supply, fill #1
  Filled 2023-01-13: qty 90, 90d supply, fill #2
  Filled 2023-04-16: qty 90, 90d supply, fill #3

## 2022-05-09 MED ORDER — FENOFIBRATE 145 MG PO TABS
ORAL_TABLET | ORAL | 3 refills | Status: AC
  Filled 2022-05-09 – 2022-09-13 (×2): qty 90, 90d supply, fill #0
  Filled 2022-12-08: qty 90, 90d supply, fill #1

## 2022-05-09 MED ORDER — GLIPIZIDE ER 2.5 MG PO TB24
ORAL_TABLET | ORAL | 3 refills | Status: AC
  Filled 2022-08-02: qty 90, 90d supply, fill #0
  Filled 2022-10-30: qty 90, 90d supply, fill #1
  Filled 2023-01-31: qty 90, 90d supply, fill #2
  Filled 2023-04-27: qty 90, 90d supply, fill #3

## 2022-05-09 MED ORDER — DAYVIGO 10 MG PO TABS
1.0000 | ORAL_TABLET | Freq: Every day | ORAL | 2 refills | Status: AC
  Filled 2022-05-09: qty 30, 30d supply, fill #0

## 2022-05-15 ENCOUNTER — Other Ambulatory Visit: Payer: Self-pay

## 2022-05-31 ENCOUNTER — Other Ambulatory Visit: Payer: Self-pay

## 2022-06-04 ENCOUNTER — Other Ambulatory Visit: Payer: Self-pay

## 2022-06-17 ENCOUNTER — Other Ambulatory Visit: Payer: Self-pay

## 2022-06-21 ENCOUNTER — Other Ambulatory Visit: Payer: Self-pay

## 2022-07-01 ENCOUNTER — Other Ambulatory Visit: Payer: Self-pay | Admitting: Internal Medicine

## 2022-07-01 DIAGNOSIS — Z1231 Encounter for screening mammogram for malignant neoplasm of breast: Secondary | ICD-10-CM

## 2022-07-18 ENCOUNTER — Other Ambulatory Visit: Payer: Self-pay

## 2022-07-29 DIAGNOSIS — I10 Essential (primary) hypertension: Secondary | ICD-10-CM | POA: Diagnosis not present

## 2022-07-29 DIAGNOSIS — N1831 Chronic kidney disease, stage 3a: Secondary | ICD-10-CM | POA: Diagnosis not present

## 2022-07-29 DIAGNOSIS — E1122 Type 2 diabetes mellitus with diabetic chronic kidney disease: Secondary | ICD-10-CM | POA: Diagnosis not present

## 2022-08-02 ENCOUNTER — Other Ambulatory Visit: Payer: Self-pay

## 2022-08-16 ENCOUNTER — Other Ambulatory Visit: Payer: Self-pay

## 2022-08-19 DIAGNOSIS — Z780 Asymptomatic menopausal state: Secondary | ICD-10-CM | POA: Diagnosis not present

## 2022-08-19 DIAGNOSIS — E119 Type 2 diabetes mellitus without complications: Secondary | ICD-10-CM | POA: Diagnosis not present

## 2022-08-26 ENCOUNTER — Other Ambulatory Visit: Payer: Self-pay

## 2022-08-26 DIAGNOSIS — N182 Chronic kidney disease, stage 2 (mild): Secondary | ICD-10-CM | POA: Diagnosis not present

## 2022-08-26 DIAGNOSIS — E119 Type 2 diabetes mellitus without complications: Secondary | ICD-10-CM | POA: Diagnosis not present

## 2022-08-26 DIAGNOSIS — Z0000 Encounter for general adult medical examination without abnormal findings: Secondary | ICD-10-CM | POA: Diagnosis not present

## 2022-08-26 DIAGNOSIS — K5909 Other constipation: Secondary | ICD-10-CM | POA: Diagnosis not present

## 2022-08-26 MED ORDER — CLONAZEPAM 1 MG PO TABS
1.0000 mg | ORAL_TABLET | Freq: Every evening | ORAL | 0 refills | Status: DC | PRN
  Filled 2022-08-26: qty 30, 30d supply, fill #0

## 2022-08-27 ENCOUNTER — Other Ambulatory Visit: Payer: Self-pay

## 2022-08-28 ENCOUNTER — Other Ambulatory Visit: Payer: Self-pay

## 2022-08-28 MED ORDER — ZOSTER VAC RECOMB ADJUVANTED 50 MCG/0.5ML IM SUSR
INTRAMUSCULAR | 0 refills | Status: DC
  Filled 2022-08-28: qty 0.5, 1d supply, fill #0
  Filled 2022-08-29: qty 0.5, fill #0
  Filled 2022-09-04: qty 0.5, 1d supply, fill #0

## 2022-08-29 ENCOUNTER — Other Ambulatory Visit: Payer: Self-pay

## 2022-08-30 ENCOUNTER — Ambulatory Visit
Admission: RE | Admit: 2022-08-30 | Discharge: 2022-08-30 | Disposition: A | Discharge status: Home / Self Care | Payer: 59 | Source: Ambulatory Visit | Attending: Internal Medicine | Admitting: Internal Medicine

## 2022-08-30 DIAGNOSIS — Z1231 Encounter for screening mammogram for malignant neoplasm of breast: Secondary | ICD-10-CM | POA: Diagnosis not present

## 2022-09-03 ENCOUNTER — Other Ambulatory Visit: Payer: Self-pay

## 2022-09-04 ENCOUNTER — Other Ambulatory Visit: Payer: Self-pay

## 2022-09-13 ENCOUNTER — Other Ambulatory Visit: Payer: Self-pay

## 2022-10-04 ENCOUNTER — Other Ambulatory Visit: Payer: Self-pay

## 2022-10-08 ENCOUNTER — Other Ambulatory Visit: Payer: Self-pay

## 2022-10-08 MED ORDER — CLONAZEPAM 1 MG PO TABS
1.0000 mg | ORAL_TABLET | Freq: Every evening | ORAL | 0 refills | Status: AC | PRN
  Filled 2022-10-08: qty 30, 30d supply, fill #0

## 2022-10-17 ENCOUNTER — Other Ambulatory Visit: Payer: Self-pay

## 2022-10-21 ENCOUNTER — Other Ambulatory Visit: Payer: Self-pay

## 2022-11-11 ENCOUNTER — Other Ambulatory Visit: Payer: Self-pay

## 2022-11-12 ENCOUNTER — Other Ambulatory Visit: Payer: Self-pay

## 2022-11-12 MED ORDER — LOSARTAN POTASSIUM 50 MG PO TABS
50.0000 mg | ORAL_TABLET | Freq: Every day | ORAL | 1 refills | Status: DC
  Filled 2022-11-12: qty 90, 90d supply, fill #0
  Filled 2023-02-05: qty 90, 90d supply, fill #1

## 2022-11-14 ENCOUNTER — Other Ambulatory Visit: Payer: Self-pay

## 2022-11-14 MED ORDER — ZOSTER VAC RECOMB ADJUVANTED 50 MCG/0.5ML IM SUSR
INTRAMUSCULAR | 0 refills | Status: AC
  Filled 2022-11-14: qty 1, 1d supply, fill #0

## 2022-11-27 DIAGNOSIS — N1831 Chronic kidney disease, stage 3a: Secondary | ICD-10-CM | POA: Diagnosis not present

## 2022-11-27 DIAGNOSIS — I10 Essential (primary) hypertension: Secondary | ICD-10-CM | POA: Diagnosis not present

## 2022-11-27 DIAGNOSIS — E1122 Type 2 diabetes mellitus with diabetic chronic kidney disease: Secondary | ICD-10-CM | POA: Diagnosis not present

## 2022-12-02 ENCOUNTER — Other Ambulatory Visit: Payer: Self-pay

## 2022-12-03 ENCOUNTER — Other Ambulatory Visit: Payer: Self-pay

## 2022-12-09 ENCOUNTER — Other Ambulatory Visit: Payer: Self-pay

## 2023-01-13 ENCOUNTER — Other Ambulatory Visit: Payer: Self-pay

## 2023-01-22 DIAGNOSIS — H0288B Meibomian gland dysfunction left eye, upper and lower eyelids: Secondary | ICD-10-CM | POA: Diagnosis not present

## 2023-01-22 DIAGNOSIS — H0288A Meibomian gland dysfunction right eye, upper and lower eyelids: Secondary | ICD-10-CM | POA: Diagnosis not present

## 2023-01-22 DIAGNOSIS — E119 Type 2 diabetes mellitus without complications: Secondary | ICD-10-CM | POA: Diagnosis not present

## 2023-01-22 DIAGNOSIS — H5203 Hypermetropia, bilateral: Secondary | ICD-10-CM | POA: Diagnosis not present

## 2023-01-22 DIAGNOSIS — H40023 Open angle with borderline findings, high risk, bilateral: Secondary | ICD-10-CM | POA: Diagnosis not present

## 2023-02-18 DIAGNOSIS — E119 Type 2 diabetes mellitus without complications: Secondary | ICD-10-CM | POA: Diagnosis not present

## 2023-02-26 ENCOUNTER — Other Ambulatory Visit: Payer: Self-pay

## 2023-02-26 DIAGNOSIS — E119 Type 2 diabetes mellitus without complications: Secondary | ICD-10-CM | POA: Diagnosis not present

## 2023-02-26 DIAGNOSIS — N182 Chronic kidney disease, stage 2 (mild): Secondary | ICD-10-CM | POA: Diagnosis not present

## 2023-02-26 DIAGNOSIS — K5909 Other constipation: Secondary | ICD-10-CM | POA: Diagnosis not present

## 2023-02-26 DIAGNOSIS — E559 Vitamin D deficiency, unspecified: Secondary | ICD-10-CM | POA: Diagnosis not present

## 2023-02-26 DIAGNOSIS — F5101 Primary insomnia: Secondary | ICD-10-CM | POA: Diagnosis not present

## 2023-02-26 MED ORDER — GLIPIZIDE ER 2.5 MG PO TB24
2.5000 mg | ORAL_TABLET | Freq: Every day | ORAL | 3 refills | Status: DC
  Filled 2023-02-26 – 2023-07-28 (×2): qty 90, 90d supply, fill #0
  Filled 2023-10-27: qty 90, 90d supply, fill #1
  Filled 2024-01-27: qty 90, 90d supply, fill #2

## 2023-02-26 MED ORDER — FENOFIBRATE 145 MG PO TABS
145.0000 mg | ORAL_TABLET | Freq: Every day | ORAL | 3 refills | Status: DC
  Filled 2023-02-26: qty 90, 90d supply, fill #0
  Filled 2023-06-11: qty 90, 90d supply, fill #1
  Filled 2023-09-14: qty 90, 90d supply, fill #2
  Filled 2023-12-22: qty 90, 90d supply, fill #3

## 2023-02-26 MED ORDER — CLONAZEPAM 1 MG PO TABS
1.0000 mg | ORAL_TABLET | Freq: Every evening | ORAL | 0 refills | Status: AC | PRN
  Filled 2023-02-26: qty 30, 30d supply, fill #0

## 2023-02-26 MED ORDER — LUBIPROSTONE 8 MCG PO CAPS
8.0000 ug | ORAL_CAPSULE | Freq: Two times a day (BID) | ORAL | 3 refills | Status: DC
  Filled 2023-02-26: qty 180, 90d supply, fill #0
  Filled 2023-07-02: qty 180, 90d supply, fill #1
  Filled 2023-09-30: qty 180, 90d supply, fill #2
  Filled 2024-01-04: qty 180, 90d supply, fill #3

## 2023-02-26 MED ORDER — PANTOPRAZOLE SODIUM 40 MG PO TBEC
40.0000 mg | DELAYED_RELEASE_TABLET | Freq: Every day | ORAL | 3 refills | Status: AC
  Filled 2023-02-26: qty 90, 90d supply, fill #0

## 2023-02-26 MED ORDER — DAPAGLIFLOZIN PROPANEDIOL 10 MG PO TABS
10.0000 mg | ORAL_TABLET | Freq: Every day | ORAL | 3 refills | Status: AC
  Filled 2023-02-26: qty 90, 90d supply, fill #0

## 2023-02-26 MED ORDER — LOSARTAN POTASSIUM 50 MG PO TABS
50.0000 mg | ORAL_TABLET | Freq: Every day | ORAL | 1 refills | Status: DC
  Filled 2023-02-26 – 2023-05-09 (×2): qty 90, 90d supply, fill #0
  Filled 2023-08-11: qty 90, 90d supply, fill #1

## 2023-02-27 ENCOUNTER — Other Ambulatory Visit: Payer: Self-pay

## 2023-04-03 DIAGNOSIS — E1122 Type 2 diabetes mellitus with diabetic chronic kidney disease: Secondary | ICD-10-CM | POA: Diagnosis not present

## 2023-04-03 DIAGNOSIS — I10 Essential (primary) hypertension: Secondary | ICD-10-CM | POA: Diagnosis not present

## 2023-04-03 DIAGNOSIS — N1831 Chronic kidney disease, stage 3a: Secondary | ICD-10-CM | POA: Diagnosis not present

## 2023-05-09 ENCOUNTER — Other Ambulatory Visit: Payer: Self-pay

## 2023-07-02 ENCOUNTER — Other Ambulatory Visit: Payer: Self-pay

## 2023-07-08 ENCOUNTER — Other Ambulatory Visit: Payer: Self-pay | Admitting: Internal Medicine

## 2023-07-08 DIAGNOSIS — Z1231 Encounter for screening mammogram for malignant neoplasm of breast: Secondary | ICD-10-CM

## 2023-07-13 ENCOUNTER — Other Ambulatory Visit: Payer: Self-pay

## 2023-07-14 ENCOUNTER — Other Ambulatory Visit: Payer: Self-pay

## 2023-07-14 MED ORDER — PANTOPRAZOLE SODIUM 40 MG PO TBEC
40.0000 mg | DELAYED_RELEASE_TABLET | Freq: Every day | ORAL | 3 refills | Status: DC
  Filled 2023-07-14: qty 90, 90d supply, fill #0
  Filled 2023-10-13: qty 90, 90d supply, fill #1
  Filled 2024-01-04: qty 90, 90d supply, fill #2
  Filled 2024-04-12: qty 90, 90d supply, fill #3

## 2023-07-14 MED ORDER — DAPAGLIFLOZIN PROPANEDIOL 10 MG PO TABS
10.0000 mg | ORAL_TABLET | Freq: Every day | ORAL | 3 refills | Status: DC
  Filled 2023-07-14: qty 90, 90d supply, fill #0
  Filled 2023-10-13 – 2023-10-17 (×2): qty 90, 90d supply, fill #1
  Filled 2024-01-12: qty 90, 90d supply, fill #2
  Filled 2024-04-12: qty 90, 90d supply, fill #3

## 2023-07-15 ENCOUNTER — Other Ambulatory Visit: Payer: Self-pay

## 2023-07-17 DIAGNOSIS — R300 Dysuria: Secondary | ICD-10-CM | POA: Diagnosis not present

## 2023-07-17 DIAGNOSIS — R350 Frequency of micturition: Secondary | ICD-10-CM | POA: Diagnosis not present

## 2023-07-21 ENCOUNTER — Other Ambulatory Visit: Payer: Self-pay

## 2023-07-21 DIAGNOSIS — E119 Type 2 diabetes mellitus without complications: Secondary | ICD-10-CM | POA: Diagnosis not present

## 2023-07-21 DIAGNOSIS — N23 Unspecified renal colic: Secondary | ICD-10-CM | POA: Diagnosis not present

## 2023-07-21 DIAGNOSIS — R300 Dysuria: Secondary | ICD-10-CM | POA: Diagnosis not present

## 2023-07-21 DIAGNOSIS — N182 Chronic kidney disease, stage 2 (mild): Secondary | ICD-10-CM | POA: Diagnosis not present

## 2023-07-21 MED ORDER — NITROFURANTOIN MONOHYD MACRO 100 MG PO CAPS
ORAL_CAPSULE | ORAL | 0 refills | Status: AC
  Filled 2023-07-21: qty 14, 7d supply, fill #0

## 2023-07-21 MED ORDER — PHENAZOPYRIDINE HCL 100 MG PO TABS
ORAL_TABLET | ORAL | 0 refills | Status: AC
  Filled 2023-07-21: qty 9, 3d supply, fill #0

## 2023-07-28 ENCOUNTER — Other Ambulatory Visit: Payer: Self-pay | Admitting: Internal Medicine

## 2023-07-28 ENCOUNTER — Other Ambulatory Visit: Payer: Self-pay

## 2023-07-28 DIAGNOSIS — N182 Chronic kidney disease, stage 2 (mild): Secondary | ICD-10-CM

## 2023-07-28 DIAGNOSIS — N23 Unspecified renal colic: Secondary | ICD-10-CM

## 2023-07-28 MED ORDER — GLIPIZIDE ER 2.5 MG PO TB24
2.5000 mg | ORAL_TABLET | Freq: Every day | ORAL | 3 refills | Status: AC
  Filled 2023-07-28: qty 90, 90d supply, fill #0

## 2023-08-04 ENCOUNTER — Ambulatory Visit
Admission: RE | Admit: 2023-08-04 | Discharge: 2023-08-04 | Disposition: A | Discharge status: Home / Self Care | Payer: Commercial Managed Care - PPO | Source: Ambulatory Visit | Attending: Internal Medicine | Admitting: Internal Medicine

## 2023-08-04 DIAGNOSIS — N182 Chronic kidney disease, stage 2 (mild): Secondary | ICD-10-CM | POA: Diagnosis not present

## 2023-08-04 DIAGNOSIS — N23 Unspecified renal colic: Secondary | ICD-10-CM | POA: Insufficient documentation

## 2023-08-04 DIAGNOSIS — R109 Unspecified abdominal pain: Secondary | ICD-10-CM | POA: Diagnosis not present

## 2023-08-19 DIAGNOSIS — E559 Vitamin D deficiency, unspecified: Secondary | ICD-10-CM | POA: Diagnosis not present

## 2023-08-19 DIAGNOSIS — Z79899 Other long term (current) drug therapy: Secondary | ICD-10-CM | POA: Diagnosis not present

## 2023-08-19 DIAGNOSIS — N182 Chronic kidney disease, stage 2 (mild): Secondary | ICD-10-CM | POA: Diagnosis not present

## 2023-08-19 DIAGNOSIS — F5101 Primary insomnia: Secondary | ICD-10-CM | POA: Diagnosis not present

## 2023-08-19 DIAGNOSIS — E119 Type 2 diabetes mellitus without complications: Secondary | ICD-10-CM | POA: Diagnosis not present

## 2023-08-29 ENCOUNTER — Other Ambulatory Visit: Payer: Self-pay

## 2023-08-29 DIAGNOSIS — E559 Vitamin D deficiency, unspecified: Secondary | ICD-10-CM | POA: Diagnosis not present

## 2023-08-29 DIAGNOSIS — N182 Chronic kidney disease, stage 2 (mild): Secondary | ICD-10-CM | POA: Diagnosis not present

## 2023-08-29 DIAGNOSIS — K5909 Other constipation: Secondary | ICD-10-CM | POA: Diagnosis not present

## 2023-08-29 DIAGNOSIS — E119 Type 2 diabetes mellitus without complications: Secondary | ICD-10-CM | POA: Diagnosis not present

## 2023-08-29 DIAGNOSIS — F5101 Primary insomnia: Secondary | ICD-10-CM | POA: Diagnosis not present

## 2023-08-29 DIAGNOSIS — Z0000 Encounter for general adult medical examination without abnormal findings: Secondary | ICD-10-CM | POA: Diagnosis not present

## 2023-08-29 DIAGNOSIS — N3281 Overactive bladder: Secondary | ICD-10-CM | POA: Diagnosis not present

## 2023-08-29 MED ORDER — PREDNISONE 20 MG PO TABS
ORAL_TABLET | ORAL | 0 refills | Status: DC
  Filled 2023-08-29: qty 15, 10d supply, fill #0

## 2023-08-29 MED ORDER — AREXVY 120 MCG/0.5ML IM SUSR: 0.5000 mL | Freq: Once | INTRAMUSCULAR | 0 refills | Status: AC

## 2023-08-29 MED ORDER — VITAMIN D (ERGOCALCIFEROL) 1.25 MG (50000 UNIT) PO CAPS
50000.0000 [IU] | ORAL_CAPSULE | ORAL | 1 refills | Status: AC
  Filled 2023-08-29 (×2): qty 12, 84d supply, fill #0
  Filled 2023-11-09: qty 12, 84d supply, fill #1

## 2023-09-02 ENCOUNTER — Ambulatory Visit
Admission: RE | Admit: 2023-09-02 | Discharge: 2023-09-02 | Disposition: A | Discharge status: Home / Self Care | Payer: Commercial Managed Care - PPO | Source: Ambulatory Visit | Attending: Internal Medicine | Admitting: Internal Medicine

## 2023-09-02 DIAGNOSIS — Z1231 Encounter for screening mammogram for malignant neoplasm of breast: Secondary | ICD-10-CM | POA: Diagnosis not present

## 2023-09-30 ENCOUNTER — Other Ambulatory Visit: Payer: Self-pay

## 2023-10-02 ENCOUNTER — Other Ambulatory Visit: Payer: Self-pay

## 2023-10-02 DIAGNOSIS — I10 Essential (primary) hypertension: Secondary | ICD-10-CM | POA: Diagnosis not present

## 2023-10-02 DIAGNOSIS — E1122 Type 2 diabetes mellitus with diabetic chronic kidney disease: Secondary | ICD-10-CM | POA: Diagnosis not present

## 2023-10-02 DIAGNOSIS — N1831 Chronic kidney disease, stage 3a: Secondary | ICD-10-CM | POA: Diagnosis not present

## 2023-10-02 MED ORDER — LOSARTAN POTASSIUM 50 MG PO TABS
50.0000 mg | ORAL_TABLET | Freq: Every day | ORAL | 3 refills | Status: DC
  Filled 2023-10-02 – 2023-11-02 (×2): qty 90, 90d supply, fill #0
  Filled 2024-01-27: qty 90, 90d supply, fill #1
  Filled 2024-04-21: qty 90, 90d supply, fill #2
  Filled 2024-07-22: qty 90, 90d supply, fill #3

## 2023-10-02 MED ORDER — DAPAGLIFLOZIN PROPANEDIOL 10 MG PO TABS
10.0000 mg | ORAL_TABLET | Freq: Every morning | ORAL | 3 refills | Status: AC
  Filled 2023-10-02: qty 90, 90d supply, fill #0

## 2023-10-13 ENCOUNTER — Other Ambulatory Visit: Payer: Self-pay

## 2023-10-16 ENCOUNTER — Other Ambulatory Visit: Payer: Self-pay | Admitting: Nephrology

## 2023-10-16 DIAGNOSIS — E1122 Type 2 diabetes mellitus with diabetic chronic kidney disease: Secondary | ICD-10-CM

## 2023-10-16 DIAGNOSIS — E213 Hyperparathyroidism, unspecified: Secondary | ICD-10-CM

## 2023-10-17 ENCOUNTER — Other Ambulatory Visit: Payer: Self-pay

## 2023-10-20 ENCOUNTER — Other Ambulatory Visit: Payer: Self-pay

## 2023-10-29 ENCOUNTER — Encounter
Admission: RE | Admit: 2023-10-29 | Discharge: 2023-10-29 | Disposition: A | Discharge status: Home / Self Care | Payer: Commercial Managed Care - PPO | Source: Ambulatory Visit | Attending: Nephrology | Admitting: Nephrology

## 2023-10-29 ENCOUNTER — Encounter
Admission: RE | Admit: 2023-10-29 | Discharge: 2023-10-29 | Discharge status: Home / Self Care | Payer: Commercial Managed Care - PPO | Source: Ambulatory Visit | Attending: Nephrology | Admitting: Nephrology

## 2023-10-29 DIAGNOSIS — E1122 Type 2 diabetes mellitus with diabetic chronic kidney disease: Secondary | ICD-10-CM

## 2023-10-29 DIAGNOSIS — E213 Hyperparathyroidism, unspecified: Secondary | ICD-10-CM | POA: Diagnosis not present

## 2023-10-29 MED ORDER — TECHNETIUM TC 99M SESTAMIBI - CARDIOLITE
23.5700 | Freq: Once | INTRAVENOUS | Status: AC | PRN
  Administered 2023-10-29: 23.57 via INTRAVENOUS

## 2023-11-02 ENCOUNTER — Other Ambulatory Visit: Payer: Self-pay

## 2024-01-05 ENCOUNTER — Other Ambulatory Visit: Payer: Self-pay

## 2024-01-07 ENCOUNTER — Other Ambulatory Visit: Payer: Self-pay

## 2024-01-07 DIAGNOSIS — E119 Type 2 diabetes mellitus without complications: Secondary | ICD-10-CM | POA: Diagnosis not present

## 2024-01-07 DIAGNOSIS — F5101 Primary insomnia: Secondary | ICD-10-CM | POA: Diagnosis not present

## 2024-01-07 DIAGNOSIS — Z780 Asymptomatic menopausal state: Secondary | ICD-10-CM | POA: Diagnosis not present

## 2024-01-07 DIAGNOSIS — K5909 Other constipation: Secondary | ICD-10-CM | POA: Diagnosis not present

## 2024-01-07 DIAGNOSIS — N182 Chronic kidney disease, stage 2 (mild): Secondary | ICD-10-CM | POA: Diagnosis not present

## 2024-01-07 MED ORDER — TRAZODONE HCL 100 MG PO TABS
100.0000 mg | ORAL_TABLET | Freq: Every evening | ORAL | 0 refills | Status: DC | PRN
Start: 2024-01-07 — End: 2024-04-07
  Filled 2024-01-07: qty 60, 60d supply, fill #0

## 2024-01-12 ENCOUNTER — Other Ambulatory Visit: Payer: Self-pay

## 2024-01-12 DIAGNOSIS — Z780 Asymptomatic menopausal state: Secondary | ICD-10-CM | POA: Diagnosis not present

## 2024-02-03 ENCOUNTER — Other Ambulatory Visit: Payer: Self-pay

## 2024-02-03 MED ORDER — ERGOCALCIFEROL 1.25 MG (50000 UT) PO CAPS
1.0000 | ORAL_CAPSULE | ORAL | 1 refills | Status: DC
  Filled 2024-02-03: qty 12, 84d supply, fill #0
  Filled 2024-04-29: qty 12, 84d supply, fill #1

## 2024-03-29 ENCOUNTER — Other Ambulatory Visit: Payer: Self-pay

## 2024-03-29 MED ORDER — FENOFIBRATE 145 MG PO TABS
145.0000 mg | ORAL_TABLET | Freq: Every day | ORAL | 3 refills | Status: AC
  Filled 2024-03-29: qty 90, 90d supply, fill #0
  Filled 2024-06-26: qty 90, 90d supply, fill #1
  Filled 2024-09-20: qty 90, 90d supply, fill #2

## 2024-03-30 ENCOUNTER — Other Ambulatory Visit: Payer: Self-pay

## 2024-04-01 DIAGNOSIS — E119 Type 2 diabetes mellitus without complications: Secondary | ICD-10-CM | POA: Diagnosis not present

## 2024-04-01 DIAGNOSIS — N182 Chronic kidney disease, stage 2 (mild): Secondary | ICD-10-CM | POA: Diagnosis not present

## 2024-04-03 ENCOUNTER — Other Ambulatory Visit: Payer: Self-pay

## 2024-04-05 ENCOUNTER — Other Ambulatory Visit: Payer: Self-pay

## 2024-04-05 MED ORDER — LUBIPROSTONE 8 MCG PO CAPS
8.0000 ug | ORAL_CAPSULE | Freq: Two times a day (BID) | ORAL | 3 refills | Status: DC
  Filled 2024-04-05: qty 180, 90d supply, fill #0

## 2024-04-07 ENCOUNTER — Other Ambulatory Visit: Payer: Self-pay

## 2024-04-07 DIAGNOSIS — E119 Type 2 diabetes mellitus without complications: Secondary | ICD-10-CM | POA: Diagnosis not present

## 2024-04-07 DIAGNOSIS — G8929 Other chronic pain: Secondary | ICD-10-CM | POA: Diagnosis not present

## 2024-04-07 DIAGNOSIS — M25511 Pain in right shoulder: Secondary | ICD-10-CM | POA: Diagnosis not present

## 2024-04-07 DIAGNOSIS — Z1331 Encounter for screening for depression: Secondary | ICD-10-CM | POA: Diagnosis not present

## 2024-04-07 DIAGNOSIS — M25512 Pain in left shoulder: Secondary | ICD-10-CM | POA: Diagnosis not present

## 2024-04-07 DIAGNOSIS — N182 Chronic kidney disease, stage 2 (mild): Secondary | ICD-10-CM | POA: Diagnosis not present

## 2024-04-07 DIAGNOSIS — F5101 Primary insomnia: Secondary | ICD-10-CM | POA: Diagnosis not present

## 2024-04-07 MED ORDER — ALPRAZOLAM 0.5 MG PO TABS
0.5000 mg | ORAL_TABLET | Freq: Every evening | ORAL | 0 refills | Status: AC | PRN
  Filled 2024-04-07: qty 30, 30d supply, fill #0

## 2024-04-21 ENCOUNTER — Other Ambulatory Visit: Payer: Self-pay

## 2024-04-22 ENCOUNTER — Other Ambulatory Visit: Payer: Self-pay

## 2024-04-22 MED ORDER — GLIPIZIDE ER 2.5 MG PO TB24
2.5000 mg | ORAL_TABLET | Freq: Every day | ORAL | 3 refills | Status: AC
  Filled 2024-04-22: qty 90, 90d supply, fill #0
  Filled 2024-07-22: qty 90, 90d supply, fill #1
  Filled 2024-10-15: qty 90, 90d supply, fill #2
  Filled 2024-10-19: qty 90, 90d supply, fill #0

## 2024-04-28 DIAGNOSIS — I10 Essential (primary) hypertension: Secondary | ICD-10-CM | POA: Diagnosis not present

## 2024-04-28 DIAGNOSIS — N1831 Chronic kidney disease, stage 3a: Secondary | ICD-10-CM | POA: Diagnosis not present

## 2024-04-28 DIAGNOSIS — E1122 Type 2 diabetes mellitus with diabetic chronic kidney disease: Secondary | ICD-10-CM | POA: Diagnosis not present

## 2024-04-29 ENCOUNTER — Other Ambulatory Visit: Payer: Self-pay

## 2024-05-05 DIAGNOSIS — E1122 Type 2 diabetes mellitus with diabetic chronic kidney disease: Secondary | ICD-10-CM | POA: Diagnosis not present

## 2024-05-05 DIAGNOSIS — N1831 Chronic kidney disease, stage 3a: Secondary | ICD-10-CM | POA: Diagnosis not present

## 2024-05-05 DIAGNOSIS — I10 Essential (primary) hypertension: Secondary | ICD-10-CM | POA: Diagnosis not present

## 2024-06-10 ENCOUNTER — Other Ambulatory Visit: Payer: Self-pay

## 2024-06-10 DIAGNOSIS — R140 Abdominal distension (gaseous): Secondary | ICD-10-CM | POA: Diagnosis not present

## 2024-06-10 DIAGNOSIS — K5909 Other constipation: Secondary | ICD-10-CM | POA: Diagnosis not present

## 2024-06-10 MED ORDER — LUBIPROSTONE 24 MCG PO CAPS
24.0000 ug | ORAL_CAPSULE | Freq: Two times a day (BID) | ORAL | 0 refills | Status: DC
  Filled 2024-06-10: qty 60, 30d supply, fill #0

## 2024-06-29 ENCOUNTER — Other Ambulatory Visit: Payer: Self-pay | Admitting: Internal Medicine

## 2024-06-29 ENCOUNTER — Ambulatory Visit
Admission: RE | Admit: 2024-06-29 | Discharge: 2024-06-29 | Disposition: A | Discharge status: Home / Self Care | Source: Ambulatory Visit | Attending: Internal Medicine | Admitting: Internal Medicine

## 2024-06-29 ENCOUNTER — Other Ambulatory Visit: Payer: Self-pay

## 2024-06-29 DIAGNOSIS — Z9049 Acquired absence of other specified parts of digestive tract: Secondary | ICD-10-CM | POA: Diagnosis not present

## 2024-06-29 DIAGNOSIS — N182 Chronic kidney disease, stage 2 (mild): Secondary | ICD-10-CM | POA: Diagnosis not present

## 2024-06-29 DIAGNOSIS — K5909 Other constipation: Secondary | ICD-10-CM | POA: Diagnosis not present

## 2024-06-29 DIAGNOSIS — R1084 Generalized abdominal pain: Secondary | ICD-10-CM | POA: Insufficient documentation

## 2024-06-29 DIAGNOSIS — F5101 Primary insomnia: Secondary | ICD-10-CM | POA: Diagnosis not present

## 2024-06-29 DIAGNOSIS — Z90710 Acquired absence of both cervix and uterus: Secondary | ICD-10-CM | POA: Diagnosis not present

## 2024-06-29 DIAGNOSIS — E119 Type 2 diabetes mellitus without complications: Secondary | ICD-10-CM | POA: Diagnosis not present

## 2024-06-29 MED ORDER — IOHEXOL 300 MG/ML  SOLN
100.0000 mL | Freq: Once | INTRAMUSCULAR | Status: AC | PRN
  Administered 2024-06-29: 100 mL via INTRAVENOUS

## 2024-06-29 MED ORDER — DICYCLOMINE HCL 10 MG PO CAPS
10.0000 mg | ORAL_CAPSULE | Freq: Two times a day (BID) | ORAL | 0 refills | Status: AC
  Filled 2024-06-29: qty 20, 10d supply, fill #0

## 2024-06-29 MED ORDER — LUBIPROSTONE 24 MCG PO CAPS
24.0000 ug | ORAL_CAPSULE | Freq: Two times a day (BID) | ORAL | 0 refills | Status: AC
  Filled 2024-06-29 – 2024-07-12 (×2): qty 60, 30d supply, fill #0

## 2024-06-30 ENCOUNTER — Other Ambulatory Visit: Payer: Self-pay | Admitting: Internal Medicine

## 2024-06-30 DIAGNOSIS — Z1231 Encounter for screening mammogram for malignant neoplasm of breast: Secondary | ICD-10-CM

## 2024-07-06 ENCOUNTER — Other Ambulatory Visit: Payer: Self-pay

## 2024-07-06 MED ORDER — DAPAGLIFLOZIN PROPANEDIOL 10 MG PO TABS
10.0000 mg | ORAL_TABLET | Freq: Every day | ORAL | 3 refills | Status: AC
  Filled 2024-07-06: qty 90, 90d supply, fill #0
  Filled 2024-10-02: qty 90, 90d supply, fill #1

## 2024-07-12 ENCOUNTER — Other Ambulatory Visit: Payer: Self-pay

## 2024-07-15 DIAGNOSIS — E119 Type 2 diabetes mellitus without complications: Secondary | ICD-10-CM | POA: Diagnosis not present

## 2024-07-15 DIAGNOSIS — R1084 Generalized abdominal pain: Secondary | ICD-10-CM | POA: Diagnosis not present

## 2024-07-22 ENCOUNTER — Other Ambulatory Visit: Payer: Self-pay

## 2024-07-23 ENCOUNTER — Other Ambulatory Visit: Payer: Self-pay

## 2024-07-23 MED ORDER — ERGOCALCIFEROL 1.25 MG (50000 UT) PO CAPS
1.0000 | ORAL_CAPSULE | ORAL | 1 refills | Status: AC
  Filled 2024-07-23: qty 12, 84d supply, fill #0
  Filled 2024-10-15: qty 12, 84d supply, fill #1
  Filled 2024-10-19: qty 12, 84d supply, fill #0

## 2024-09-01 ENCOUNTER — Other Ambulatory Visit: Payer: Self-pay

## 2024-09-01 DIAGNOSIS — N3281 Overactive bladder: Secondary | ICD-10-CM | POA: Diagnosis not present

## 2024-09-01 DIAGNOSIS — R1084 Generalized abdominal pain: Secondary | ICD-10-CM | POA: Diagnosis not present

## 2024-09-01 DIAGNOSIS — K5909 Other constipation: Secondary | ICD-10-CM | POA: Diagnosis not present

## 2024-09-01 DIAGNOSIS — Z1331 Encounter for screening for depression: Secondary | ICD-10-CM | POA: Diagnosis not present

## 2024-09-01 DIAGNOSIS — Z0000 Encounter for general adult medical examination without abnormal findings: Secondary | ICD-10-CM | POA: Diagnosis not present

## 2024-09-01 DIAGNOSIS — E119 Type 2 diabetes mellitus without complications: Secondary | ICD-10-CM | POA: Diagnosis not present

## 2024-09-01 DIAGNOSIS — N182 Chronic kidney disease, stage 2 (mild): Secondary | ICD-10-CM | POA: Diagnosis not present

## 2024-09-01 DIAGNOSIS — F5101 Primary insomnia: Secondary | ICD-10-CM | POA: Diagnosis not present

## 2024-09-01 MED ORDER — ALPRAZOLAM 0.5 MG PO TABS
0.5000 mg | ORAL_TABLET | Freq: Two times a day (BID) | ORAL | 2 refills | Status: AC | PRN
  Filled 2024-09-01: qty 60, 30d supply, fill #0
  Filled 2024-10-05: qty 60, 30d supply, fill #1

## 2024-09-02 ENCOUNTER — Inpatient Hospital Stay: Admission: RE | Admit: 2024-09-02 | Discharge: 2024-09-02 | Attending: Internal Medicine | Admitting: Internal Medicine

## 2024-09-02 DIAGNOSIS — Z1231 Encounter for screening mammogram for malignant neoplasm of breast: Secondary | ICD-10-CM | POA: Insufficient documentation

## 2024-09-03 ENCOUNTER — Other Ambulatory Visit: Payer: Self-pay

## 2024-09-03 MED ORDER — BOOSTRIX 5-2.5-18.5 LF-MCG/0.5 IM SUSY
0.5000 mL | PREFILLED_SYRINGE | Freq: Once | INTRAMUSCULAR | 0 refills | Status: AC
  Filled 2024-09-03: qty 0.5, 1d supply, fill #0

## 2024-09-03 MED ORDER — PREVNAR 20 0.5 ML IM SUSY
0.5000 mL | PREFILLED_SYRINGE | Freq: Once | INTRAMUSCULAR | 0 refills | Status: AC
  Filled 2024-09-03: qty 0.5, 1d supply, fill #0

## 2024-09-20 ENCOUNTER — Other Ambulatory Visit (HOSPITAL_COMMUNITY): Payer: Self-pay

## 2024-09-20 ENCOUNTER — Other Ambulatory Visit: Payer: Self-pay

## 2024-10-02 ENCOUNTER — Other Ambulatory Visit: Payer: Self-pay

## 2024-10-05 ENCOUNTER — Other Ambulatory Visit: Payer: Self-pay

## 2024-10-12 ENCOUNTER — Other Ambulatory Visit: Payer: Self-pay

## 2024-10-12 MED ORDER — PHENAZOPYRIDINE HCL 100 MG PO TABS
100.0000 mg | ORAL_TABLET | Freq: Three times a day (TID) | ORAL | 0 refills | Status: AC
  Filled 2024-10-12: qty 9, 3d supply, fill #0

## 2024-10-12 MED ORDER — NITROFURANTOIN MONOHYD MACRO 100 MG PO CAPS
ORAL_CAPSULE | Freq: Two times a day (BID) | ORAL | 0 refills | Status: AC
  Filled 2024-10-12: qty 10, 5d supply, fill #0

## 2024-10-15 ENCOUNTER — Other Ambulatory Visit: Payer: Self-pay

## 2024-10-16 ENCOUNTER — Other Ambulatory Visit: Payer: Self-pay

## 2024-10-16 ENCOUNTER — Other Ambulatory Visit (HOSPITAL_COMMUNITY): Payer: Self-pay

## 2024-10-18 ENCOUNTER — Other Ambulatory Visit: Payer: Self-pay

## 2024-10-18 ENCOUNTER — Other Ambulatory Visit (HOSPITAL_COMMUNITY): Payer: Self-pay

## 2024-10-18 MED ORDER — LOSARTAN POTASSIUM 50 MG PO TABS
50.0000 mg | ORAL_TABLET | Freq: Every day | ORAL | 3 refills | Status: AC
  Filled 2024-10-19: qty 90, 90d supply, fill #0

## 2024-10-19 ENCOUNTER — Other Ambulatory Visit: Payer: Self-pay

## 2024-10-19 ENCOUNTER — Other Ambulatory Visit (HOSPITAL_COMMUNITY): Payer: Self-pay
